# Patient Record
Sex: Female | Born: 1945 | Hispanic: No | Marital: Married | Smoking: Current every day smoker
Health system: Southern US, Community
[De-identification: ages and names within clinical notes are randomized; demographics above are authoritative.]

## PROBLEM LIST (undated history)

## (undated) DIAGNOSIS — E890 Postprocedural hypothyroidism: Secondary | ICD-10-CM

## (undated) DIAGNOSIS — I6350 Cerebral infarction due to unspecified occlusion or stenosis of unspecified cerebral artery: Secondary | ICD-10-CM

## (undated) DIAGNOSIS — E042 Nontoxic multinodular goiter: Secondary | ICD-10-CM

## (undated) HISTORY — DX: Nontoxic multinodular goiter: E04.2

## (undated) HISTORY — DX: Postprocedural hypothyroidism: E89.0

## (undated) HISTORY — DX: Cerebral infarction due to unspecified occlusion or stenosis of unspecified cerebral artery: I63.50

---

## 2008-05-31 ENCOUNTER — Encounter: Payer: Self-pay | Admitting: Endocrinology

## 2008-06-01 HISTORY — PX: OTHER SURGICAL HISTORY: SHX169

## 2008-06-15 ENCOUNTER — Encounter: Payer: Self-pay | Admitting: Endocrinology

## 2008-06-16 ENCOUNTER — Encounter: Payer: Self-pay | Admitting: Endocrinology

## 2008-06-16 HISTORY — PX: MRI: SHX5353

## 2008-07-01 ENCOUNTER — Encounter: Payer: Self-pay | Admitting: Endocrinology

## 2008-08-09 ENCOUNTER — Encounter: Payer: Self-pay | Admitting: Endocrinology

## 2008-09-27 ENCOUNTER — Encounter: Payer: Self-pay | Admitting: Endocrinology

## 2008-09-27 DIAGNOSIS — I6350 Cerebral infarction due to unspecified occlusion or stenosis of unspecified cerebral artery: Secondary | ICD-10-CM

## 2008-09-27 HISTORY — DX: Cerebral infarction due to unspecified occlusion or stenosis of unspecified cerebral artery: I63.50

## 2008-11-08 ENCOUNTER — Ambulatory Visit: Payer: Self-pay | Admitting: Endocrinology

## 2008-11-08 LAB — CONVERTED CEMR LAB: TSH: 0.03 microintl units/mL — ABNORMAL LOW (ref 0.35–5.50)

## 2008-11-25 ENCOUNTER — Encounter (INDEPENDENT_AMBULATORY_CARE_PROVIDER_SITE_OTHER): Payer: Self-pay | Admitting: *Deleted

## 2008-11-29 ENCOUNTER — Encounter: Payer: Self-pay | Admitting: Endocrinology

## 2008-12-16 ENCOUNTER — Telehealth (INDEPENDENT_AMBULATORY_CARE_PROVIDER_SITE_OTHER): Payer: Self-pay | Admitting: *Deleted

## 2008-12-27 ENCOUNTER — Ambulatory Visit: Payer: Self-pay | Admitting: Endocrinology

## 2008-12-27 DIAGNOSIS — E890 Postprocedural hypothyroidism: Secondary | ICD-10-CM

## 2008-12-27 HISTORY — DX: Postprocedural hypothyroidism: E89.0

## 2008-12-27 LAB — CONVERTED CEMR LAB: Free T4: 0.6 ng/dL (ref 0.6–1.6)

## 2009-03-02 ENCOUNTER — Ambulatory Visit: Payer: Self-pay | Admitting: Endocrinology

## 2009-03-02 LAB — CONVERTED CEMR LAB: TSH: 11.66 microintl units/mL — ABNORMAL HIGH (ref 0.35–5.50)

## 2009-07-07 ENCOUNTER — Ambulatory Visit: Payer: Self-pay | Admitting: Endocrinology

## 2009-07-07 DIAGNOSIS — E042 Nontoxic multinodular goiter: Secondary | ICD-10-CM

## 2009-07-07 HISTORY — DX: Nontoxic multinodular goiter: E04.2

## 2009-07-08 LAB — CONVERTED CEMR LAB: TSH: 3.39 microintl units/mL (ref 0.35–5.50)

## 2009-08-15 ENCOUNTER — Ambulatory Visit: Payer: Self-pay

## 2009-08-15 ENCOUNTER — Encounter (INDEPENDENT_AMBULATORY_CARE_PROVIDER_SITE_OTHER): Payer: Self-pay | Admitting: Neurology

## 2009-08-15 ENCOUNTER — Ambulatory Visit: Payer: Self-pay | Admitting: Internal Medicine

## 2009-08-15 ENCOUNTER — Ambulatory Visit (HOSPITAL_COMMUNITY): Admission: RE | Admit: 2009-08-15 | Discharge: 2009-08-15 | Payer: Self-pay | Admitting: Neurology

## 2009-08-16 ENCOUNTER — Encounter (INDEPENDENT_AMBULATORY_CARE_PROVIDER_SITE_OTHER): Payer: Self-pay | Admitting: Radiology

## 2010-05-02 ENCOUNTER — Ambulatory Visit: Payer: Self-pay | Admitting: Endocrinology

## 2010-11-14 NOTE — Assessment & Plan Note (Signed)
Summary: f/u appt/#/cd   Vital Signs:  Patient profile:   65 year old female Height:      66 inches (167.64 cm) Weight:      191.25 pounds (86.93 kg) BMI:     30.98 O2 Sat:      98 % on Room air Temp:     97.3 degrees F (36.28 degrees C) oral Pulse rate:   77 / minute BP sitting:   142 / 86  (left arm) Cuff size:   regular  Vitals Entered By: Brenton Grills MA (May 02, 2010 2:23 PM)  O2 Flow:  Room air CC: F/U appt/pt needs refill on Levothroxine/aj Comments Pt is no longer taking Diovan, Naproxen, Plavix   Referring Provider:  Georgana Curio, NP ([facility]) Primary Provider:  DR Jerrye Bushy [facility] [phone])  CC:  F/U appt/pt needs refill on Levothroxine/aj.  History of Present Illness: pt had i-131 rx for hyperthyroidism in 2009, for multinodular goiter.  she does not notice the goiter. she takes synthroid 50/d as rx'ed.     Current Medications (verified): 1)  Diovan Hct 160-12.5 Mg Tabs (Valsartan-Hydrochlorothiazide) .... Take 1 By Mouth Qd 2)  Naproxen 500 Mg Tabs (Naproxen) .... Take 1 Two Times A Day 3)  Plavix 75 Mg Tabs (Clopidogrel Bisulfate) .Marland Kitchen.. 1 Daily 4)  Levothyroxine Sodium 50 Mcg Tabs (Levothyroxine Sodium) .Marland Kitchen.. 1 Qd 5)  Benicar Hct 40-25 Mg Tabs (Olmesartan Medoxomil-Hctz) .Marland Kitchen.. 1 Tablet By Mouth Once Daily  Allergies (verified): 1)  ! Penicillin  Past History:  Past Medical History: Last updated: 11/08/2008 HYPERTHYROIDISM (ICD-242.90) DIABETES MELLITUS, FAMILY HX (ICD-V18.0) CEREBROVASCULAR ACCIDENT (ICD-434.91)  Review of Systems       The patient complains of weight gain.    Physical Exam  General:  normal appearance.   Neck:  thyroid is minimally enlarged bilat, with an irregular surface, but no palpable nodule. Additional Exam:  FastTSH                   5.39 uIU/mL     Impression & Recommendations:  Problem # 1:  GOITER, MULTINODULAR (ICD-241.1) Assessment Unchanged  Problem # 2:  HYPOTHYROIDISM, POST-RADIATION  (ICD-244.1) well-controlled  Medications Added to Medication List This Visit: 1)  Benicar Hct 40-25 Mg Tabs (Olmesartan medoxomil-hctz) .Marland Kitchen.. 1 tablet by mouth once daily  Other Orders: TLB-TSH (Thyroid Stimulating Hormone) (84443-TSH) Est. Patient Level III (29562)  Patient Instructions: 1)  blood tests are being ordered for you today.  please call [phone] to hear your test results. 2)  pending the test results, please continue the same levothyroxine (50 micrograms/day) 3)  return 6 months 4)  because you require less than a full amount of thyroid hormone, you are at risk for recurrence of the overactive thyroid. 5)  (update: i left message on phone-tree:  rx as we discussed) Prescriptions: LEVOTHYROXINE SODIUM 50 MCG TABS (LEVOTHYROXINE SODIUM) 1 qd  #30 Tablet x 6   Entered and Authorized by:   Minus Breeding MD   Signed by:   Minus Breeding MD on 05/02/2010   Method used:   Electronically to        [facility] [address]       [address] [place]  [address]       Ph: [phone]       Fax: [phone]   RxID:   [id number]

## 2010-11-21 ENCOUNTER — Other Ambulatory Visit: Payer: MEDICARE

## 2010-11-21 ENCOUNTER — Encounter: Payer: Self-pay | Admitting: Endocrinology

## 2010-11-21 ENCOUNTER — Other Ambulatory Visit: Payer: Self-pay | Admitting: Endocrinology

## 2010-11-21 ENCOUNTER — Ambulatory Visit (INDEPENDENT_AMBULATORY_CARE_PROVIDER_SITE_OTHER): Payer: MEDICARE | Admitting: Endocrinology

## 2010-11-21 ENCOUNTER — Encounter (INDEPENDENT_AMBULATORY_CARE_PROVIDER_SITE_OTHER): Payer: Self-pay | Admitting: *Deleted

## 2010-11-21 DIAGNOSIS — E042 Nontoxic multinodular goiter: Secondary | ICD-10-CM

## 2010-11-21 DIAGNOSIS — R062 Wheezing: Secondary | ICD-10-CM

## 2010-11-21 DIAGNOSIS — E890 Postprocedural hypothyroidism: Secondary | ICD-10-CM

## 2010-11-30 NOTE — Assessment & Plan Note (Signed)
Summary: f/u app/#   Vital Signs:  Patient profile:   65 year old female Menstrual status:  postmenopausal Height:      66 inches (167.64 cm) Weight:      208.75 pounds (94.89 kg) BMI:     33.81 O2 Sat:      97 % on Room air Temp:     98.2 degrees F (36.78 degrees C) oral Pulse rate:   77 / minute Pulse rhythm:   regular BP sitting:   142 / 88  (left arm) Cuff size:   large  Vitals Entered By: Brenton Grills CMA [facility] Dull) (November 21, 2010 1:34 PM)  O2 Flow:  Room air CC: Follow up on thyroid/refill on Levothyroxine/pt declined flu shot/aj Is Patient Diabetic? No Comments Pt is due for mammogram and may due for pap and has never had a colonoscopy or pneumovax     Menstrual Status postmenopausal   Referring Provider:  Georgana Curio, NP [facility]) Primary Provider:  DR Jerrye Bushy [facility] [phone])  CC:  Follow up on thyroid/refill on Levothyroxine/pt declined flu shot/aj.  History of Present Illness: pt had i-131 rx for hyperthyroidism in 2009, for multinodular goiter.  she does not notice the goiter. she takes synthroid 50/d as rx'ed.   pt states few days of of moderate congestion in the nose, but no assoc sore throat.  she has wheezing.   Current Medications (verified): 1)  Levothyroxine Sodium 50 Mcg Tabs (Levothyroxine Sodium) .Marland Kitchen.. 1 Qd 2)  Benicar Hct 40-25 Mg Tabs (Olmesartan Medoxomil-Hctz) .Marland Kitchen.. 1 Tablet By Mouth Once Daily  Allergies (verified): 1)  ! Penicillin  Past History:  Past Medical History: Last updated: 11/08/2008 HYPERTHYROIDISM (ICD-242.90) DIABETES MELLITUS, FAMILY HX (ICD-V18.0) CEREBROVASCULAR ACCIDENT (ICD-434.91)  Review of Systems       The patient complains of weight gain.  The patient denies fever and prolonged cough.         denies earache  Physical Exam  General:  normal appearance.   Neck:  thyroid is minimally enlarged bilat, with an irregular surface, but no palpable nodule. Additional Exam:  FastTSH               [H]  7.84 uIU/mL      Impression & Recommendations:  Problem # 1:  HYPOTHYROIDISM, POST-RADIATION (ICD-244.1) needs increased rx  Problem # 2:  uri new  Problem # 3:  GOITER, MULTINODULAR (ICD-241.1) Assessment: Improved  Medications Added to Medication List This Visit: 1)  Azithromycin 500 Mg Tabs (Azithromycin) .Marland Kitchen.. 1 tab once daily 2)  Levothyroxine Sodium 75 Mcg Tabs (Levothyroxine sodium) .Marland Kitchen.. 1 tab once daily  Other Orders: Spirometry w/Graph (94010) TLB-TSH (Thyroid Stimulating Hormone) (84443-TSH) Est. Patient Level IV (08657)  Patient Instructions: 1)  blood tests are being ordered for you today.  please call [phone] to hear your test results. 2)  here is a sample of "advair-100."  take 1 puff two times a day.  rinse mouth after using.  3)  take azithromycin 500 mg once daily 4)  take loratadine-d (non-prescription) as needed for congestion. 5)  Please schedule a follow-up appointment in 6 months. 6)  (update: i left message on phone-tree:  increase synthroid to 75 micrograms/d) Prescriptions: LEVOTHYROXINE SODIUM 75 MCG TABS (LEVOTHYROXINE SODIUM) 1 tab once daily  #90 x 2   Entered and Authorized by:   Minus Breeding MD   Signed by:   Minus Breeding MD on 11/21/2010   Method used:   [facility] [address]       [address], [place]  [address]       Ph: [phone]       Fax: [phone]   RxID:   [id number] AZITHROMYCIN 500 MG TABS (AZITHROMYCIN) 1 tab once daily  #6 x 0   Entered and Authorized by:   Minus Breeding MD   Signed by:   Minus Breeding MD on 11/21/2010   Method used:   [facility]  [address]       [address], [place]  [address]       Ph: [phone]       Fax: [phone]   RxID:   [id number]    Orders Added: 1)  Spirometry w/Graph [94010] 2)  TLB-TSH (Thyroid Stimulating Hormone) [84443-TSH] 3)  Est. Patient Level IV [12458]

## 2011-08-30 ENCOUNTER — Other Ambulatory Visit: Payer: Self-pay | Admitting: *Deleted

## 2011-08-30 MED ORDER — LEVOTHYROXINE SODIUM 75 MCG PO TABS: 75.0000 ug | ORAL_TABLET | Freq: Every day | ORAL | Status: DC

## 2011-08-30 NOTE — Telephone Encounter (Signed)
Left msg on vm mother is needing refill on thyroid med....08/30/11@12 :16pm/LMB

## 2011-08-30 NOTE — Telephone Encounter (Signed)
Rx sent to pharmacy, pt informed via VM and to callback office with any questions/concerns.

## 2012-02-27 ENCOUNTER — Other Ambulatory Visit: Payer: Self-pay | Admitting: Endocrinology

## 2012-03-27 ENCOUNTER — Encounter: Payer: Self-pay | Admitting: Endocrinology

## 2012-03-27 ENCOUNTER — Other Ambulatory Visit (INDEPENDENT_AMBULATORY_CARE_PROVIDER_SITE_OTHER): Payer: Medicare Other

## 2012-03-27 ENCOUNTER — Ambulatory Visit (INDEPENDENT_AMBULATORY_CARE_PROVIDER_SITE_OTHER): Payer: Medicare Other | Admitting: Endocrinology

## 2012-03-27 VITALS — BP 140/84 | HR 84 | Temp 98.3°F | Ht 65.0 in | Wt 194.0 lb

## 2012-03-27 DIAGNOSIS — E890 Postprocedural hypothyroidism: Secondary | ICD-10-CM

## 2012-03-27 LAB — TSH: TSH: 3.89 u[IU]/mL (ref 0.35–5.50)

## 2012-03-27 NOTE — Progress Notes (Signed)
  Subjective:    Patient ID: [name], female    DOB: [birth year], 66 y.o.   MRN: [id number]  HPI In 2009, pt had i-131 rx for hyperthyroidism, due to a multinodular goiter.  she does not notice the goiter. she takes synthroid 75/d as rx'ed.  pt states she feels well in general. Past Medical History  Diagnosis Date  . CEREBROVASCULAR ACCIDENT 09/27/2008    Qualifier: Diagnosis of  By: Charlsie Quest RMA, Lucy    . GOITER, MULTINODULAR 07/07/2009    Qualifier: Diagnosis of  By: Everardo All MD, Cleophas Dunker   . HYPOTHYROIDISM, POST-RADIATION 12/27/2008    Qualifier: Diagnosis of  By: Everardo All MD, Cleophas Dunker     Past Surgical History  Procedure Date  . Mri 06/16/2008    cervical spine  . Thyroid uptake 06/01/2008    History   Social History  . Marital Status: Married    Spouse Name: N/A    Number of Children: N/A  . Years of Education: N/A   Occupational History  . Retired    Social History Main Topics  . Smoking status: Current Everyday Smoker  . Smokeless tobacco: Not on file  . Alcohol Use: Not on file  . Drug Use: Not on file  . Sexually Active: Not on file   Other Topics Concern  . Not on file   Social History Narrative   Here with son [name])    Current Outpatient Prescriptions on File Prior to Visit  Medication Sig Dispense Refill  . olmesartan-hydrochlorothiazide (BENICAR HCT) 40-25 MG per tablet Take 1 tablet by mouth daily.      Marland Kitchen levothyroxine (SYNTHROID, LEVOTHROID) 75 MCG tablet TAKE 1 TABLET BY MOUTH EVERY DAY  30 tablet  11    Allergies  Allergen Reactions  . Penicillins     Family History  Problem Relation Age of Onset  . Thyroid disease Neg Hx     No thyroid disease in immediate family    BP 140/84  Pulse 84  Temp 98.3 F (36.8 C) (Oral)  Ht 5\' 5"  (1.651 m)  Wt 194 lb (87.998 kg)  BMI 32.28 kg/m2  SpO2 97%  Review of Systems Denies weight change    Objective:   Physical Exam VITAL SIGNS:  See vs page GENERAL: no distress.  In  wheelchair NECK: There is no palpable thyroid enlargement.  No thyroid nodule is palpable.  No palpable lymphadenopathy at the anterior neck.  Lab Results  Component Value Date   TSH 3.89 03/27/2012      Assessment & Plan:  Hypothyroidism.  well-replaced

## 2012-03-27 NOTE — Patient Instructions (Addendum)
blood tests are being requested for you today.  You will receive a letter with results. Please return in 1 year. most of the time, a "lumpy thyroid" will eventually become overactive again.  this is usually a slow process, happening over the span of many years.  If this happens, the first thing we would notice is that you would not need as much levothyroxine.

## 2012-03-28 ENCOUNTER — Telehealth: Payer: Self-pay | Admitting: *Deleted

## 2012-03-28 MED ORDER — LEVOTHYROXINE SODIUM 75 MCG PO TABS: ORAL_TABLET | ORAL | Status: AC

## 2012-03-28 NOTE — Telephone Encounter (Signed)
Called pt to inform of lab results, pt informed via VM and to callback office with any questions/concerns (letter also mailed to pt) also sent in rx for Levothyroxine for 1 year.

## 2012-12-13 ENCOUNTER — Ambulatory Visit: Payer: Self-pay | Admitting: Internal Medicine

## 2013-01-04 ENCOUNTER — Inpatient Hospital Stay: Payer: Self-pay | Admitting: Internal Medicine

## 2013-01-04 LAB — COMPREHENSIVE METABOLIC PANEL
Albumin: 2.8 g/dL — ABNORMAL LOW (ref 3.4–5.0)
Alkaline Phosphatase: 157 U/L — ABNORMAL HIGH (ref 50–136)
Anion Gap: 5 — ABNORMAL LOW (ref 7–16)
BUN: 64 mg/dL — ABNORMAL HIGH (ref 7–18)
Calcium, Total: 8.3 mg/dL — ABNORMAL LOW (ref 8.5–10.1)
EGFR (African American): 34 — ABNORMAL LOW
Potassium: 4.5 mmol/L (ref 3.5–5.1)
SGOT(AST): 42 U/L — ABNORMAL HIGH (ref 15–37)

## 2013-01-04 LAB — CBC
MCH: 32.2 pg (ref 26.0–34.0)
MCV: 96 fL (ref 80–100)
Platelet: 336 10*3/uL (ref 150–440)
RBC: 3.27 10*6/uL — ABNORMAL LOW (ref 3.80–5.20)
RDW: 16.3 % — ABNORMAL HIGH (ref 11.5–14.5)
WBC: 17.8 10*3/uL — ABNORMAL HIGH (ref 3.6–11.0)

## 2013-01-04 LAB — CK TOTAL AND CKMB (NOT AT ARMC): CK, Total: 129 U/L (ref 21–215)

## 2013-01-05 LAB — COMPREHENSIVE METABOLIC PANEL
BUN: 55 mg/dL — ABNORMAL HIGH (ref 7–18)
Bilirubin,Total: 0.4 mg/dL (ref 0.2–1.0)
Calcium, Total: 7.7 mg/dL — ABNORMAL LOW (ref 8.5–10.1)
Co2: 33 mmol/L — ABNORMAL HIGH (ref 21–32)
EGFR (African American): 36 — ABNORMAL LOW
EGFR (Non-African Amer.): 31 — ABNORMAL LOW
Osmolality: 290 (ref 275–301)
Sodium: 136 mmol/L (ref 136–145)
Total Protein: 7.3 g/dL (ref 6.4–8.2)

## 2013-01-05 LAB — CBC WITH DIFFERENTIAL/PLATELET
Basophil %: 0.4 %
Eosinophil #: 0 10*3/uL (ref 0.0–0.7)
HCT: 24.6 % — ABNORMAL LOW (ref 35.0–47.0)
HGB: 8.3 g/dL — ABNORMAL LOW (ref 12.0–16.0)
Lymphocyte #: 1.6 10*3/uL (ref 1.0–3.6)
Lymphocyte %: 10.1 %
MCH: 31.8 pg (ref 26.0–34.0)
MCHC: 33.9 g/dL (ref 32.0–36.0)
MCV: 94 fL (ref 80–100)
Monocyte #: 1 x10 3/mm — ABNORMAL HIGH (ref 0.2–0.9)
Monocyte %: 6.2 %
Neutrophil #: 12.8 10*3/uL — ABNORMAL HIGH (ref 1.4–6.5)
Neutrophil %: 83.2 %
RDW: 16.6 % — ABNORMAL HIGH (ref 11.5–14.5)
WBC: 15.5 10*3/uL — ABNORMAL HIGH (ref 3.6–11.0)

## 2013-01-05 LAB — CK TOTAL AND CKMB (NOT AT ARMC)
CK, Total: 128 U/L (ref 21–215)
CK-MB: <0.5 ng/mL — ABNORMAL LOW (ref 0.5–3.6)

## 2013-01-05 LAB — PHOSPHORUS: Phosphorus: 2.1 mg/dL — ABNORMAL LOW (ref 2.5–4.9)

## 2013-01-05 LAB — MAGNESIUM: Magnesium: 2.3 mg/dL

## 2013-01-05 LAB — TROPONIN I
Troponin-I: 0.19 ng/mL — ABNORMAL HIGH
Troponin-I: 0.15 ng/mL — ABNORMAL HIGH

## 2013-01-06 LAB — BASIC METABOLIC PANEL
BUN: 33 mg/dL — ABNORMAL HIGH (ref 7–18)
Chloride: 98 mmol/L (ref 98–107)
Co2: 33 mmol/L — ABNORMAL HIGH (ref 21–32)
Creatinine: 1.35 mg/dL — ABNORMAL HIGH (ref 0.60–1.30)
EGFR (African American): 47 — ABNORMAL LOW
EGFR (Non-African Amer.): 41 — ABNORMAL LOW
Glucose: 149 mg/dL — ABNORMAL HIGH (ref 65–99)
Osmolality: 280 (ref 275–301)
Sodium: 135 mmol/L — ABNORMAL LOW (ref 136–145)

## 2013-01-06 LAB — CBC WITH DIFFERENTIAL/PLATELET
Basophil #: 0.1 10*3/uL (ref 0.0–0.1)
Eosinophil %: 3.2 %
HCT: 21.9 % — ABNORMAL LOW (ref 35.0–47.0)
HGB: 7.8 g/dL — ABNORMAL LOW (ref 12.0–16.0)
Lymphocyte #: 1.3 10*3/uL (ref 1.0–3.6)
Lymphocyte %: 11.6 %
MCHC: 35.5 g/dL (ref 32.0–36.0)
Monocyte #: 1.1 x10 3/mm — ABNORMAL HIGH (ref 0.2–0.9)
Monocyte %: 9.5 %
Neutrophil %: 74.4 %
RBC: 2.39 10*6/uL — ABNORMAL LOW (ref 3.80–5.20)
RDW: 16.7 % — ABNORMAL HIGH (ref 11.5–14.5)

## 2013-01-06 LAB — PHOSPHORUS: Phosphorus: 2.8 mg/dL (ref 2.5–4.9)

## 2013-01-06 LAB — MAGNESIUM: Magnesium: 1.8 mg/dL

## 2013-01-07 LAB — BASIC METABOLIC PANEL
BUN: 25 mg/dL — ABNORMAL HIGH (ref 7–18)
Co2: 29 mmol/L (ref 21–32)
EGFR (African American): 57 — ABNORMAL LOW
EGFR (Non-African Amer.): 49 — ABNORMAL LOW
Osmolality: 281 (ref 275–301)
Potassium: 3.9 mmol/L (ref 3.5–5.1)
Sodium: 137 mmol/L (ref 136–145)

## 2013-01-07 LAB — CBC WITH DIFFERENTIAL/PLATELET
Basophil #: 0 10*3/uL (ref 0.0–0.1)
Basophil %: 0.1 %
Eosinophil #: 0 10*3/uL (ref 0.0–0.7)
Eosinophil %: 0 %
HGB: 7.3 g/dL — ABNORMAL LOW (ref 12.0–16.0)
Lymphocyte %: 8.4 %
MCHC: 34.3 g/dL (ref 32.0–36.0)
MCV: 94 fL (ref 80–100)
Monocyte %: 6 %
Neutrophil #: 6.7 10*3/uL — ABNORMAL HIGH (ref 1.4–6.5)
Platelet: 255 10*3/uL (ref 150–440)
RBC: 2.26 10*6/uL — ABNORMAL LOW (ref 3.80–5.20)
RDW: 16.4 % — ABNORMAL HIGH (ref 11.5–14.5)

## 2013-01-08 LAB — BASIC METABOLIC PANEL
Anion Gap: 4 — ABNORMAL LOW (ref 7–16)
BUN: 33 mg/dL — ABNORMAL HIGH (ref 7–18)
Creatinine: 1.09 mg/dL (ref 0.60–1.30)
EGFR (African American): >60
EGFR (Non-African Amer.): 52 — ABNORMAL LOW
Glucose: 142 mg/dL — ABNORMAL HIGH (ref 65–99)
Osmolality: 287 (ref 275–301)
Potassium: 3.5 mmol/L (ref 3.5–5.1)

## 2013-01-08 LAB — OCCULT BLOOD X 1 CARD TO LAB, STOOL: Occult Blood, Feces: POSITIVE

## 2013-01-08 LAB — CBC WITH DIFFERENTIAL/PLATELET
HGB: 7 g/dL — ABNORMAL LOW (ref 12.0–16.0)
Lymphocyte %: 7.6 %
MCV: 92 fL (ref 80–100)
Monocyte #: 0.4 x10 3/mm (ref 0.2–0.9)
Monocyte %: 4.2 %
Neutrophil %: 87.6 %
RDW: 16.5 % — ABNORMAL HIGH (ref 11.5–14.5)

## 2013-01-08 LAB — MAGNESIUM: Magnesium: 2.1 mg/dL

## 2013-01-09 LAB — CBC WITH DIFFERENTIAL/PLATELET
Basophil #: 0 10*3/uL (ref 0.0–0.1)
Basophil %: 0.1 %
Eosinophil #: 0 10*3/uL (ref 0.0–0.7)
Eosinophil %: 0 %
HCT: 26.9 % — ABNORMAL LOW (ref 35.0–47.0)
Lymphocyte #: 0.8 10*3/uL — ABNORMAL LOW (ref 1.0–3.6)
MCH: 31.9 pg (ref 26.0–34.0)
MCV: 92 fL (ref 80–100)
Monocyte #: 0.5 x10 3/mm (ref 0.2–0.9)
Monocyte %: 5.1 %
Neutrophil #: 8.7 10*3/uL — ABNORMAL HIGH (ref 1.4–6.5)
Neutrophil %: 87.3 %
Platelet: 282 10*3/uL (ref 150–440)
WBC: 10 10*3/uL (ref 3.6–11.0)

## 2013-01-09 LAB — BASIC METABOLIC PANEL
Anion Gap: 4 — ABNORMAL LOW (ref 7–16)
Chloride: 106 mmol/L (ref 98–107)
Co2: 31 mmol/L (ref 21–32)
Creatinine: 1.06 mg/dL (ref 0.60–1.30)
EGFR (African American): >60
EGFR (Non-African Amer.): 54 — ABNORMAL LOW
Osmolality: 294 (ref 275–301)
Potassium: 3 mmol/L — ABNORMAL LOW (ref 3.5–5.1)
Sodium: 141 mmol/L (ref 136–145)

## 2013-01-09 LAB — POTASSIUM: Potassium: 3.2 mmol/L — ABNORMAL LOW (ref 3.5–5.1)

## 2013-01-10 LAB — CBC WITH DIFFERENTIAL/PLATELET
Basophil #: 0 10*3/uL (ref 0.0–0.1)
Eosinophil #: 0 10*3/uL (ref 0.0–0.7)
HCT: 27.9 % — ABNORMAL LOW (ref 35.0–47.0)
Lymphocyte #: 0.8 10*3/uL — ABNORMAL LOW (ref 1.0–3.6)
Lymphocyte %: 8.3 %
MCHC: 33.2 g/dL (ref 32.0–36.0)
MCV: 90 fL (ref 80–100)
Monocyte #: 0.6 x10 3/mm (ref 0.2–0.9)
Monocyte %: 5.6 %
Neutrophil #: 8.5 10*3/uL — ABNORMAL HIGH (ref 1.4–6.5)
Neutrophil %: 85.9 %
RBC: 3.1 10*6/uL — ABNORMAL LOW (ref 3.80–5.20)
WBC: 9.9 10*3/uL (ref 3.6–11.0)

## 2013-01-10 LAB — BASIC METABOLIC PANEL
Anion Gap: 6 — ABNORMAL LOW (ref 7–16)
BUN: 37 mg/dL — ABNORMAL HIGH (ref 7–18)
Calcium, Total: 7.8 mg/dL — ABNORMAL LOW (ref 8.5–10.1)
Chloride: 108 mmol/L — ABNORMAL HIGH (ref 98–107)
EGFR (African American): >60
Glucose: 137 mg/dL — ABNORMAL HIGH (ref 65–99)
Potassium: 3.4 mmol/L — ABNORMAL LOW (ref 3.5–5.1)
Sodium: 143 mmol/L (ref 136–145)

## 2013-01-10 LAB — MAGNESIUM: Magnesium: 2.3 mg/dL

## 2013-01-11 LAB — MAGNESIUM: Magnesium: 2.2 mg/dL

## 2013-01-11 LAB — POTASSIUM: Potassium: 3.6 mmol/L (ref 3.5–5.1)

## 2013-01-12 LAB — BASIC METABOLIC PANEL
Anion Gap: 5 — ABNORMAL LOW (ref 7–16)
Calcium, Total: 7.8 mg/dL — ABNORMAL LOW (ref 8.5–10.1)
Co2: 28 mmol/L (ref 21–32)
Creatinine: 0.91 mg/dL (ref 0.60–1.30)
Glucose: 92 mg/dL (ref 65–99)
Potassium: 3.6 mmol/L (ref 3.5–5.1)
Sodium: 142 mmol/L (ref 136–145)

## 2013-01-12 LAB — CBC WITH DIFFERENTIAL/PLATELET
Basophil #: 0.1 10*3/uL (ref 0.0–0.1)
Basophil %: 0.7 %
HCT: 29.1 % — ABNORMAL LOW (ref 35.0–47.0)
HGB: 10.1 g/dL — ABNORMAL LOW (ref 12.0–16.0)
Lymphocyte #: 1.3 10*3/uL (ref 1.0–3.6)
MCH: 31.1 pg (ref 26.0–34.0)
MCHC: 34.7 g/dL (ref 32.0–36.0)
Monocyte #: 0.9 x10 3/mm (ref 0.2–0.9)
Neutrophil #: 6.7 10*3/uL — ABNORMAL HIGH (ref 1.4–6.5)
Neutrophil %: 70 %
RBC: 3.25 10*6/uL — ABNORMAL LOW (ref 3.80–5.20)
RDW: 17.7 % — ABNORMAL HIGH (ref 11.5–14.5)
WBC: 9.6 10*3/uL (ref 3.6–11.0)

## 2013-01-12 LAB — MAGNESIUM: Magnesium: 1.9 mg/dL

## 2013-01-13 ENCOUNTER — Ambulatory Visit: Payer: Self-pay | Admitting: Internal Medicine

## 2013-01-13 LAB — MAGNESIUM: Magnesium: 2 mg/dL

## 2013-01-13 LAB — BASIC METABOLIC PANEL
Co2: 28 mmol/L (ref 21–32)
EGFR (African American): >60
Sodium: 142 mmol/L (ref 136–145)

## 2013-01-13 LAB — PHOSPHORUS: Phosphorus: 2.9 mg/dL (ref 2.5–4.9)

## 2013-01-14 LAB — BASIC METABOLIC PANEL
Anion Gap: 6 — ABNORMAL LOW (ref 7–16)
Calcium, Total: 7.8 mg/dL — ABNORMAL LOW (ref 8.5–10.1)
Chloride: 104 mmol/L (ref 98–107)
EGFR (African American): >60

## 2013-01-14 LAB — CBC WITH DIFFERENTIAL/PLATELET
Eosinophil %: 5.2 %
HGB: 10.1 g/dL — ABNORMAL LOW (ref 12.0–16.0)
Lymphocyte #: 1.2 10*3/uL (ref 1.0–3.6)
Lymphocyte %: 16.8 %
MCH: 30.1 pg (ref 26.0–34.0)
MCV: 90 fL (ref 80–100)
Monocyte #: 0.7 x10 3/mm (ref 0.2–0.9)
Monocyte %: 9.7 %
Neutrophil #: 4.9 10*3/uL (ref 1.4–6.5)
Neutrophil %: 67.9 %
Platelet: 281 10*3/uL (ref 150–440)
RDW: 17.8 % — ABNORMAL HIGH (ref 11.5–14.5)
WBC: 7.2 10*3/uL (ref 3.6–11.0)

## 2013-01-16 LAB — POTASSIUM: Potassium: 3.8 mmol/L (ref 3.5–5.1)

## 2013-01-16 LAB — MAGNESIUM: Magnesium: 1.8 mg/dL

## 2013-01-19 LAB — CBC WITH DIFFERENTIAL/PLATELET
Basophil #: 0.1 10*3/uL (ref 0.0–0.1)
Basophil %: 1.2 %
Eosinophil #: 0.3 10*3/uL (ref 0.0–0.7)
Eosinophil %: 3.7 %
HGB: 10.8 g/dL — ABNORMAL LOW (ref 12.0–16.0)
MCH: 30.3 pg (ref 26.0–34.0)
MCHC: 33.7 g/dL (ref 32.0–36.0)
MCV: 90 fL (ref 80–100)
Monocyte #: 0.7 x10 3/mm (ref 0.2–0.9)
Neutrophil %: 69 %
RDW: 17.2 % — ABNORMAL HIGH (ref 11.5–14.5)
WBC: 7.9 10*3/uL (ref 3.6–11.0)

## 2013-01-19 LAB — BASIC METABOLIC PANEL
BUN: 26 mg/dL — ABNORMAL HIGH (ref 7–18)
Chloride: 98 mmol/L (ref 98–107)
EGFR (Non-African Amer.): 54 — ABNORMAL LOW
Glucose: 99 mg/dL (ref 65–99)
Osmolality: 277 (ref 275–301)
Sodium: 136 mmol/L (ref 136–145)

## 2013-01-24 ENCOUNTER — Emergency Department: Payer: Self-pay | Admitting: Emergency Medicine

## 2013-02-04 ENCOUNTER — Emergency Department: Payer: Self-pay | Admitting: Emergency Medicine

## 2013-07-15 DEATH — deceased

## 2015-02-04 NOTE — Consult Note (Signed)
Chief Complaint:  Subjective/Chief Complaint Patient seen for anemia/heme positive stool.  no change.  Nursing using the G tube portion for uses as J tube is clogged.  I have ordered a replacement J tube portion  of the feeding tube which can be changed under fluoro over a wire.  Will likely be able to do on thursday.   VITAL SIGNS/ANCILLARY NOTES: **Vital Signs.:   01-Apr-14 19:41  Temperature Temperature (F) 98.5  Celsius 36.9  Pulse Pulse 82  Respirations Respirations 21  Systolic BP Systolic BP 152  Diastolic BP (mmHg) Diastolic BP (mmHg) 89  Mean BP 110  Pulse Ox % Pulse Ox % 99  Pulse Ox Activity Level  At rest  Oxygen Delivery Trach Aerosol Mask  Pulse Ox Heart Rate 84   Electronic Signatures: Barnetta ChapelSkulskie, Avyanna Spada (MD)  (Signed 01-Apr-14 20:24)  Authored: Chief Complaint, VITAL SIGNS/ANCILLARY NOTES   Last Updated: 01-Apr-14 20:24 by Barnetta ChapelSkulskie, Kristian Mogg (MD)

## 2015-02-04 NOTE — Consult Note (Signed)
Chief Complaint:  Subjective/Chief Complaint seen for anemia, heme positive stool.  no change, minimal interaction from patietn to examiner.   VITAL SIGNS/ANCILLARY NOTES: **Vital Signs.:   31-Mar-14 12:00  Vital Signs Type Routine  Temperature Temperature (F) 97.6  Celsius 36.4  Temperature Source axillary  Pulse Pulse 70  Respirations Respirations 15  Pulse Ox % Pulse Ox % 96  Oxygen Delivery Trach Aerosol Mask  Pulse Ox Heart Rate 72    16:00  Vital Signs Type Routine  Pulse Pulse 82  Respirations Respirations 26  Systolic BP Systolic BP 244  Diastolic BP (mmHg) Diastolic BP (mmHg) 96  Mean BP 116  Pulse Ox % Pulse Ox % 97  Oxygen Delivery Trach Aerosol Mask  Pulse Ox Heart Rate 82   Brief Assessment:  Cardiac Regular   Respiratory clear BS   Gastrointestinal details normal Soft  Nontender  Nondistended  No masses palpable  Bowel sounds normal   Lab Results: Routine Chem:  31-Mar-14 04:31   B-Type Natriuretic Peptide ([facility])  1325 (Result(s) reported on 12 Jan 2013 at 08:57AM.)  Magnesium, Serum 1.9 (1.8-2.4 THERAPEUTIC RANGE: 4-7 mg/dL TOXIC: > 10 mg/dL  -----------------------)  Phosphorus, Serum 3.0 (Result(s) reported on 12 Jan 2013 at 04:59AM.)  Result Comment LABS - This specimen was collected through an   - indwelling catheter or arterial line.  - A minimum of 45ms of blood was wasted prior    - to collecting the sample.  Interpret  - results with caution.  Result(s) reported on 12 Jan 2013 at 04:50AM.  Glucose, Serum 92  BUN  23  Creatinine (comp) 0.91  Sodium, Serum 142  Potassium, Serum 3.6  Chloride, Serum  109  CO2, Serum 28  Calcium (Total), Serum  7.8  Anion Gap  5  Osmolality (calc) 286  eGFR (African American) >60  eGFR (Non-African American) >60 (eGFR values <666mmin/1.73 m2 may be an indication of chronic kidney disease (CKD). Calculated eGFR is useful in patients with stable renal function. The eGFR calculation will not be reliable  in acutely ill patients when serum creatinine is changing rapidly. It is not useful in  patients on dialysis. The eGFR calculation may not be applicable to patients at the low and high extremes of body sizes, pregnant women, and vegetarians.)  Routine Hem:  23-Mar-14 17:56   Hemoglobin (CBC)  10.5  24-Mar-14 04:05   Hemoglobin (CBC)  8.3  25-Mar-14 03:48   Hemoglobin (CBC)  7.8  26-Mar-14 04:08   Hemoglobin (CBC)  7.3  27-Mar-14 03:32   Hemoglobin (CBC)  7.0  28-Mar-14 03:54   Hemoglobin (CBC)  9.4  29-Mar-14 04:23   Hemoglobin (CBC)  9.3  31-Mar-14 04:31   WBC (CBC) 9.6  RBC (CBC)  3.25  Hemoglobin (CBC)  10.1  Hematocrit (CBC)  29.1  Platelet Count (CBC) 292  MCV 90  MCH 31.1  MCHC 34.7  RDW  17.7  Neutrophil % 70.0  Lymphocyte % 13.8  Monocyte % 8.9  Eosinophil % 6.6  Basophil % 0.7  Neutrophil #  6.7  Lymphocyte # 1.3  Monocyte # 0.9  Eosinophil # 0.6  Basophil # 0.1   Assessment/Plan:  Assessment/Plan:  Assessment 1) heme positive stool, no overt bleeding.  hgb stable.  2) PEG malfunction due to clogged J-port.  G-port is good and we will allow feeds through that port.  The J-tube portion can be changed over a wire under fluoro, and I will order the necessary part.  To change  the entire tube would require sedation and patient is high risk for this due to comorbidities.   Plan 1) as above. use of G-tube port discussed with nursing, flushes, etc.   Electronic Signatures: Loistine Simas (MD)  (Signed 31-Mar-14 17:33)  Authored: Chief Complaint, VITAL SIGNS/ANCILLARY NOTES, Brief Assessment, Lab Results, Assessment/Plan   Last Updated: 31-Mar-14 17:33 by Loistine Simas (MD)

## 2015-02-04 NOTE — Consult Note (Signed)
Chief Complaint:  Subjective/Chief Complaint seen for heme positive stool and j-tube problems.  no change, minimal interaction with examined.  no response to questions.   VITAL SIGNS/ANCILLARY NOTES: **Vital Signs.:   04-Apr-14 05:00  Vital Signs Type Routine  Temperature Temperature (F) 97.8  Celsius 36.5  Temperature Source axillary  Pulse Pulse 85  Respirations Respirations 20  Systolic BP Systolic BP 129  Diastolic BP (mmHg) Diastolic BP (mmHg) 80  Mean BP 96  Pulse Ox % Pulse Ox % 95  Pulse Ox Activity Level  At rest  Oxygen Delivery Trach Aerosol Mask    06:03  Vital Signs Type POCT  Nurse Fingerstick (mg/dL) FSBS (fasting range 16-1065-99 mg/dL) 960113  Comments/Interventions  Nurse Notified   Brief Assessment:  Cardiac Regular   Respiratory clear BS   Gastrointestinal details normal Soft  Nontender  Nondistended  No masses palpable  Bowel sounds normal   Lab Results: Routine Chem:  04-Apr-14 11:54   Magnesium, Serum 1.8 (1.8-2.4 THERAPEUTIC RANGE: 4-7 mg/dL TOXIC: > 10 mg/dL  -----------------------)  Potassium, Serum 3.8 (Result(s) reported on 16 Jan 2013 at 12:34PM.)  Phosphorus, Serum 2.8 (Result(s) reported on 16 Jan 2013 at 12:37PM.)  Routine Hem:  02-Apr-14 05:23   Platelet Count (CBC) 281   Assessment/Plan:  Assessment/Plan:  Assessment 1) heme positive stool-stable hgb.  no evodence ot significant GI bleeding  2) J-tube blocked.   Plan 1) will change J-tube over a wire with radiology assistance/fluoro.  I have discussed the proceedure with patients son, Theadore Nanhillip Jemmott, he understands the need to change the tube and method of changing the tube, stated he knew  because of the last time it had to be changed.   Electronic Signatures: Barnetta ChapelSkulskie, Martin (MD)  (Signed 04-Apr-14 13:31)  Authored: Chief Complaint, VITAL SIGNS/ANCILLARY NOTES, Brief Assessment, Lab Results, Assessment/Plan   Last Updated: 04-Apr-14 13:31 by Barnetta ChapelSkulskie, Martin (MD)

## 2015-02-04 NOTE — H&P (Signed)
DATE OF BIRTH:  1945/12/09  DATE OF ADMISSION:  01/04/2013  REFERRING PHYSICIAN:  Dr. Daryel November  ADMITTING PHYSICIAN: Dr. Stephanie Acre  PRIMARY CARE PHYSICIAN:  None.  CHIEF COMPLAINT:  Brought in by EMS for hypotension and hypoxia.   HISTORY OF PRESENT ILLNESS:  This is a 69 year old African-American female with past medical history of status post cardiac arrest, acute encephalopathy, left cerebral stroke, hypomagnesemia, hypokalemia, hypophosphatemia, acute pulmonary embolism, status post IVC filter, acute on chronic diastolic failure, chronic respiratory failure, dysphagia, hypothyroidism, hypertension, diabetes, status post tracheostomy, who was brought in by EMS from [facility] secondary to hypotension and acute desaturation. History is limited, as the patient is aphakic and does not talk. Family is present at bedside, but does not know the events that took place early on tonight. Unable to get in contact with the nurse at [facility]. Per family, they were called and told that the patient had a drop in blood pressure acutely, and her saturations started to go down acutely at the nursing home. EMS was called, and she was brought to the ED. In the ED, it was noted that she has a tracheostomy site that is about a 73 old. The patient was noted to have thick, yellow secretions. Her trach was a non-cuff trach, it was taken out and replaced with a cuff trach. She was suctioned and had a lot of thick, yellow secretions, and placed on assist control by the ED physician. Her blood pressure was noted to be in the 70s and 80s systolic on admission, and acutely came up shortly afterwards with the administration of Levophed. A chest x-ray showed that she had right lower lobe diffuse infiltrates with right-sided atelectasis also. Family states that the patient was originally, about a month and a half ago, the patient was in her normal state of health until she started having  thick secretions that she could not clear. She has been a long-time smoker. They took her to the hospital in [place], and while she was there she had a significant mucus plug, developed cardiac arrest, and was in the ICU for about 6 days. Shortly after that, she developed a left-sided cerebellar stroke and had left-sided weakness and loss of sensation, and became aphasic. She was transferred to [facility] in [place] for vent management and weaning. Shortly afterwards, she had a tracheostomy done there, J tube placement done there, and once the tracheostomy was placed, she was able to get off mechanical ventilation, and she was placed on a non-cuff trach. She did fairly well and was transferred to [facility] to be close to the family. She arrived at [facility] on this past Friday, and both events ensued. Hospitalist services were consulted for further inpatient workup and management.   PRIMARY CARE PHYSICIAN:  [facility]  PAST MEDICAL HISTORY:  Cardiac arrest, acute cephalopathy, left cerebellar stroke, hypomagnesemia, hypokalemia, hypophosphatemia, history of pulmonary embolism, status post IVC filter, history of chronic diastolic failure, history of chronic respiratory failure, status post tracheostomy, history of dysphagia, hypothyroidism, hypertension, diabetes, history of seizures.  PAST SURGICAL HISTORY:  Tracheostomy and J tube placement.  MEDICATIONS PER THE MAR ARE AS FOLLOWS:  1.  K-Phos 1 tab t.i.d. per J tube. 2.  Magnesium oxide 400 mg, 1/2 tab b.i.d. per J tube. 3.  Multivitamins and minerals 15 mL per J tube. 4.  Nexium 40 mg packet, 1 packet daily per J tube for GI distress, n.p.o. 5.  nutrition formula  1.5 calories, 45 mL continuous per J tube. 6.  Pro-Stat AWC 30 mL per J tube for supplement. 7.  Simvastatin 5 mg per J tube for hyperlipidemia. 8.  Sodium bicarbonate vial 1 mL every 12 hours for mucus secretions. 9.  Synthroid 50  mcg per J tube daily. 10.  Water flushes 100 mL every 4 hours. 11.  DuoNeb 1 q. 6 hours as needed. 12.  Scopolamine patch 1.5 mg every 3 days. 13.  Accu-Cheks every 6 hours for diabetes.  14.  Guaifenesin solution/syrup 100 mg/5 mL, 10 mL every 6 hours per J tube. 15.  Keppra 500 mg 5 mL vial, 5 mL twice daily per J tube for seizure prophylaxis.   CODE STATUS:  The patient is a FULL CODE.  FAMILY HISTORY:  Mother and father with diabetes and hypertension.   SOCIAL HISTORY:  Prior to the events from 6 weeks ago, she was a smoker, 1/2 pack a day for about 38 years. Occasional drinker.   REVIEW OF SYSTEMS:  Unable to obtain, since patient is nonverbal.  PHYSICAL EXAMINATION:  At the time of my arrival, her physical examination and vital signs are as follows:   VITAL SIGNS:  Blood pressure is 112/64, temperature 98.6, pulse 96, respirations 98, 100% on PEEP of 5.  GENERAL APPEARANCE: A well-developed, well-nourished female lying in bed, not following commands, in no acute respiratory distress at this time.  HEENT: PERRLA, EOMI. No scleral icterus or conjunctivitis. Does not open mouth, cannot evaluate pharynx. Mucous membranes mildly dry. Dentition is poor. NECK:  She does have a tracheostomy in place. There are no secretions around the trach site. There is no erythema. There is no edema around the trach site. I could not palpate any nodules in the thyroid. Full range of motion is noted.  RESPIRATORY:  Coarse upper airway sounds. Decreased breath sounds bilaterally otherwise.  No wheezing and no crackles. No labored breathing is noted at this time, she is on a ventilator.  CARDIOVASCULAR:  Regular rate, regular rhythm. No murmurs. S1, S2 auscultated. No lower extremity edema.  ABDOMEN: Soft, nontender, nondistended. She does have a J tube site noted in the left upper quadrant of her abdomen. There is no discharge noted around the site.  MUSCULOSKELETAL:  Patient not following commands. She has  good tone noted.  SKIN:  No rash, no lesions, no erythema, no nodules. Skin is warm and dry.  LYMPHATIC:  No adenopathy noted in the cervical, axillary or supraclavicular regions.  NEUROLOGIC:  Difficult to assess cranial nerves II through XII. Deep tendon reflexes are intact. Sensory is intact. She does withdraw to pain on the left side and right side. She is aphasic and dysphagic also. She has some mild left upper extremity contracture.  PSYCHIATRIC:  Disoriented. Does not follow commands. She is somewhat alert.   LABS ARE AS FOLLOWS:  BNP is 602. Sodium 132, potassium 4.5, chloride 90, bicarb 37, BUN 64, creatinine 1.76, glucose 247. Osmolality of 291, AST/ALT is 42/45. Troponin is mildly elevated at 0.13. CK-MB is 0.7. CK is 129. White cell count is 17.8, hemoglobin 10.5, hematocrit 31.4, platelet count is 336. She had an ABG that showed a pH of 7.43, pCO2 of 59, pO2 of 107, 100% FiO2, bicarb 39. Lactic acid 2.9.   She did have a chest x-ray that showed tracheostomy tube in good position, right lung diffuse infiltrate and atelectasis, normal heart. There is a right lung infiltrate, especially in the lower lobe. EKG shows sinus  tachycardia, no ST-T wave changes, no depressions or elevations are noted.   ASSESSMENT AND PLAN: This is a 69 year old female with acute on chronic respiratory failure, left cerebellar stroke, pulmonary embolism, status post IVC filter, diastolic congestive heart failure, hypothyroidism, dysphagia, admitted for hypotension and hypoxia.   1.  Acute on chronic respiratory failure. Found to be acutely desaturated followed by hypotension. Upon ED arrival, her cuffless trach was changed to a cuff trach. She was placed on a ventilator and thick secretions were suctioned out. She stabilized with norepinephrine and being placed on a vent. Currently she is in stable condition. Her decompensation most likely occurs secondary to mucous plugging and increased secretions. Previously on a  scopolamine patch. Will stop this, and will stop guaifenesin. Will only put her on (Dictation Anomaly) roxanol 1ml b.i.d. Will also treat her as an aspiration pneumonia. Continue with DuoNeb, mechanical ventilation on a cuff trach currently, with good tidal volumes in the 500s, and pressure with assist control. Pulmonary consult in the morning with Dr. Jolayne Pantheronstant for vent management and trach management.   2.  Right lower lobe pneumonia, sepsis, most likely secondary to aspiration pneumonia. She does have a leukocytosis and a mild lactic acidosis noted. Will treat as stated above with mechanical ventilation, assist control with pressure. Continue with vancomycin and meropenem. Check blood and urine cultures. Will also check sputum cultures from her tracheostomy. Mild lactic acidosis at 2.9. Continue with IV fluids at 50 mL/hour and free water flushes. She did get 2 liters in the ED, and she has adequate urine output at this time. Will not give her any more boluses, given her diastolic congestive heart failure and elevated BNP.   3.  Left cerebellar ischemic stroke. Does not follow commands. She is aphasic and has dysphagia. Minor movements on her left side are noted. Continue with supportive care. Speech, PT and OT evaluation in the morning. Once able to wean off ventilator, will consider Passy-Muir valve evaluation.   4.  Dysphasia. Follow speech evaluation in the morning. Hold tube feeds for tonight. Continue with normal saline at 50 mL/hour and 100 mL of free water flushes every 6 hours of the J tube. Nutrition consult in the morning also.   5.  History of seizure. Continue with Keppra per J tube, and monitor electrolytes.   6.  Hypothyroidism. Continue with Synthroid.   7.  Social. Discussed at length with the family about her trach, and answered all questions. Family at this time stated they originally wanted an LTAC, but she was initially weaned off the vent and placed on a cuffless trach, and was  not a candidate at that time, and they want to readdress the issue of considering an LTAC evaluation.   8.  Gastrointestinal and deep vein thrombosis prophylaxis. Proton pump inhibitor and Lovenox.   9.  The patient is a FULL CODE.  PMD:  Nonlocal.   Time spent dictating and evaluating patient:  45 critical care minutes.     ____________________________ Stephanie AcreVishal Abrian Hanover, MD vm:mr D: 01/04/2013 22:07:40 ET T: 01/04/2013 23:00:31 ET JOB#: [id number]  cc: Stephanie AcreVishal Tyresa Prindiville, MD, <Dictator> Stephanie AcreVISHAL Verita Kuroda MD ELECTRONICALLY SIGNED 01/05/2013 14:18

## 2015-02-04 NOTE — Consult Note (Signed)
Impression:   69yo female w/ h/o CVA, cardiac arrest, chronic resp failure, s/p trach admitted with healthcare associated pneumonia and respiratory failure.     Her sputum culture is growing Methacillin Sensitive Staph aureus and Citrobacter, but her blood cultures are growing Enterococcus.  She is currenlty on levofloxacin and her WBC is improving.    I suspect that the Enterococcus is a contaminant.  Will not focus treatment on this.    Will change her to cephalexin per J-tube.  This is a better Staph drug and will also cover the Citrobacter.    Continue vent support as needed.    WBC has come down to normal.    No need to treat the yeast in the urine.  Electronic Signatures: Evetta Renner MPH, Rosalyn GessMichael E (MD)  (Signed on 26-Mar-14 17:21)  Authored  Last Updated: 26-Mar-14 17:21 by Timathy Newberry MPH, Rosalyn GessMichael E (MD)

## 2015-02-04 NOTE — H&P (Signed)
Past Med/Surgical Hx:  GI Bleed:   Diabetes:   HTN:   Dysphagia:   Diastolic Dysfunction:   hypophosphotemia:   Hypokalemia:   Hypomagnesemia:   encephalopathy:   Respiratory failure:   Pulmonary Embolus:   Seizures:   Brain injury:   Cardiac Arrest:   CVA/Stroke:   Pneumonia:   ALLERGIES:  Penicillin: Unknown   Assessment/Admission Diagnosis 69 yo female with chronic respiratory failure, left cerebellar stroke, PE s\p IVC filter, dCHF, hypothyroidism, dysphagia admitted for hypotension, hypoxia  1. Acute respiratory failure  - found to be acutely desaturating followed by hypotension, placed on the vent via trach and suctioned while in the ED, now stable - most likely secondary to mucus plugging and increased secretions - trach changed to cuffed trach - previous on scoplamine patch, will stop this and start roxanol 1ml bid - nebs - mechanical ventilation with cuffed trach (currently with good vt (500s)and pressure on assist control) - pulmonary consult in the AM (Dr. Belia HemanKasa)  2. RLL PNA\sepsis - most likely aspiration pneumonia - will treat as stated above and continue with vanc\meropenem - patient with thick yellow secretions, will check sputum culture from trach site - check blood and urine culture - mild lactic acidosis (2.5) - cont with IVF and free H20 flushes - UOP adequate, got 2L NS bolus, given her dCHF will not bolus anymore, especially with good UOP - levophed if needed to maintain SBP 100 to 140  3. Left cerebellar stroke - does not follow commands, aphasic - minor movements on the left side - cont with supportive care - ST\PT\OT in the am - once able to wean of vent consider paci-muer valve evaluation  4. Dysphagia - follow speech eval in the morning - hold tube feeds for tonight - NS @50cc \hr - 100 ml free water flush q6hrs of J-tube - nutrition consult in the AM  5. hx of Seizure - cont with keppra per J-tube - monitor electrolytes  6.  Hypothyroidism - cont with synthroid  7. Social  - Child psychotherapistsocial worker consult, family requesting LTAC evaluation  8. DM - SSI  GI/DVT prophylaxis - PPI/lovenox  FULL CODE PMD - non local   Plan Time spent evaluating patient = 45 critical care minutes ZOX#[id number]#[id number]   Electronic Signatures: Stephanie AcreMungal, Siah Kannan (MD)  (Signed 23-Mar-14 22:18)  Authored: PAST MEDICAL/SURGIAL HISTORY, ALLERGIES, HOME MEDICATIONS, ASSESSMENT AND PLAN   Last Updated: 23-Mar-14 22:18 by Stephanie AcreMungal, Shir Bergman (MD)

## 2015-02-04 NOTE — Consult Note (Signed)
Chief Complaint:  Subjective/Chief Complaint seen for heme positive stool.  minimal response to examiner.  no bm reported for 0ver 24 hours.   VITAL SIGNS/ANCILLARY NOTES: **Vital Signs.:   29-Mar-14 07:47  Temperature Temperature (F) 97.8    11:29  Pulse Pulse 92  Respirations Respirations 26  Systolic BP Systolic BP 732  Diastolic BP (mmHg) Diastolic BP (mmHg) 202  Mean BP 128  Pulse Ox % Pulse Ox % 97  Pulse Ox Activity Level  At rest  Oxygen Delivery Trach Aerosol Mask  Pulse Ox Heart Rate 90   Brief Assessment:  Cardiac Regular   Respiratory coarse aw noise, occ rhonchi   Gastrointestinal details normal Soft  Nontender  Nondistended  No masses palpable  Bowel sounds normal   Lab Results: Routine Chem:  29-Mar-14 04:23   Phosphorus, Serum 2.6 (Result(s) reported on 10 Jan 2013 at 05:06AM.)  Magnesium, Serum 2.3 (1.8-2.4 THERAPEUTIC RANGE: 4-7 mg/dL TOXIC: > 10 mg/dL  -----------------------)  Glucose, Serum  137  BUN  37  Creatinine (comp) 1.08  Sodium, Serum 143  Potassium, Serum  3.4  Chloride, Serum  108  CO2, Serum 29  Calcium (Total), Serum  7.8  Anion Gap  6  Osmolality (calc) 296  eGFR (African American) >60  eGFR (Non-African American)  53 (eGFR values <60m/min/1.73 m2 may be an indication of chronic kidney disease (CKD). Calculated eGFR is useful in patients with stable renal function. The eGFR calculation will not be reliable in acutely ill patients when serum creatinine is changing rapidly. It is not useful in  patients on dialysis. The eGFR calculation may not be applicable to patients at the low and high extremes of body sizes, pregnant women, and vegetarians.)  Result Comment LABS - This specimen was collected through an   - indwelling catheter or arterial line.  - A minimum of 591m of blood was wasted prior    - to collecting the sample.  Interpret  - results with caution.  Result(s) reported on 10 Jan 2013 at 05:06AM.  Routine Hem:   23-Mar-14 17:56   Hemoglobin (CBC)  10.5  24-Mar-14 04:05   Hemoglobin (CBC)  8.3  25-Mar-14 03:48   Hemoglobin (CBC)  7.8  26-Mar-14 04:08   Hemoglobin (CBC)  7.3  27-Mar-14 03:32   Hemoglobin (CBC)  7.0  28-Mar-14 03:54   Hemoglobin (CBC)  9.4  29-Mar-14 04:23   WBC (CBC) 9.9  RBC (CBC)  3.10  Hemoglobin (CBC)  9.3  Hematocrit (CBC)  27.9  Platelet Count (CBC) 298  MCV 90  MCH 29.9  MCHC 33.2  RDW  17.8  Neutrophil % 85.9  Lymphocyte % 8.3  Monocyte % 5.6  Eosinophil % 0.1  Basophil % 0.1  Neutrophil #  8.5  Lymphocyte #  0.8  Monocyte # 0.6  Eosinophil # 0.0  Basophil # 0.0 (Result(s) reported on 10 Jan 2013 at 04:57AM.)   Assessment/Plan:  Assessment/Plan:  Assessment 1) anemia, heme positive stool.  no bm for over 24 hours, no overt evidence of significant gi bleeding.  patient is not a candidate for sedated luminal evaluation.  Will check h. pylori serology.  continue bid,  will change carafate to suspension   Plan 1) feeding tube should be used with the jejunal port for feeds only or LIQUID meds.  other things particularly crushed meds need to be thoroughly crushed and solubilized with water to ensure they will not clog the tube, and the tube rinsed through to ensure no residual  remains in the tube. 2) trasnsfuse as needed.   Electronic Signatures: Loistine Simas (MD)  (Signed 29-Mar-14 13:05)  Authored: Chief Complaint, VITAL SIGNS/ANCILLARY NOTES, Brief Assessment, Lab Results, Assessment/Plan   Last Updated: 29-Mar-14 13:05 by Loistine Simas (MD)

## 2015-02-04 NOTE — Consult Note (Signed)
Chief Complaint:  Subjective/Chief Complaint seen for anemia, hemepositive stool Stable s/p tfx, no evidentd of dignificant GI bleeding. Apparently the jejunal port of the feeding tube has become clogged. Gastric port still functioning.  No change otherwise. minimal purposeful response.   VITAL SIGNS/ANCILLARY NOTES: **Vital Signs.:   30-Mar-14 12:00  Temperature Temperature (F) 97.8  Celsius 36.5  Temperature Source axillary  Pulse Pulse 74  Respirations Respirations 24  Systolic BP Systolic BP 146  Diastolic BP (mmHg) Diastolic BP (mmHg) 87  Mean BP 106  Pulse Ox % Pulse Ox % 100  Pulse Ox Activity Level  At rest  Oxygen Delivery Trach Aerosol Mask  Pulse Ox Heart Rate 74  *Intake and Output.:   30-Mar-14 06:00  Stool  150ml liquid brown stool   Brief Assessment:  Cardiac Regular   Respiratory clear BS   Gastrointestinal details normal Soft  Nondistended  Bowel sounds normal   Lab Results: Routine Micro:  28-Mar-14 14:49   Clostridium Diff Toxin by RT-PCR NEGATIVE-CLOS.DIFFICILE TOXIN NOT DETECTED BY PCR ---------------------------------- Test procedure integrates sample purification, nucleic acid amplification, and detection of the target Clostridium difficile sequence in simple or complex samplesusing real-time PCR and RT-PCR assays.  General Ref:  28-Mar-14 14:49   H.Pylori Stool Antigen ========== TEST NAME ==========  ========= RESULTS =========  = REFERENCE RANGE =  H.PYLORI,STOOL ANTIGEN  H. pylori Stool Ag, EIA H. pylori Stool Ag, EIA         [   Negative             ]          Negative               [facility]         No: [id number]           [address], [place], [place] [id number]           Mila HomerWilliam F Hancock, MD         [phone]   Result(s) reported on 11 Jan 2013 at 10:49AM.  Routine Chem:  30-Mar-14 03:47   Result Comment LABS - This specimen was collected through an   - indwelling catheter or arterial line.  - A minimum of  5mls of blood was wasted prior    - to collecting the sample.  Interpret  - results with caution.  Result(s) reported on 11 Jan 2013 at 04:22AM.  Potassium, Serum 3.6  Phosphorus, Serum  2.2 (Result(s) reported on 11 Jan 2013 at 04:24AM.)  Magnesium, Serum 2.2 (1.8-2.4 THERAPEUTIC RANGE: 4-7 mg/dL TOXIC: > 10 mg/dL  -----------------------)  Routine Hem:  23-Mar-14 17:56   Hemoglobin (CBC)  10.5  24-Mar-14 04:05   Hemoglobin (CBC)  8.3  25-Mar-14 03:48   Hemoglobin (CBC)  7.8  26-Mar-14 04:08   Hemoglobin (CBC)  7.3  27-Mar-14 03:32   Hemoglobin (CBC)  7.0  28-Mar-14 03:54   Hemoglobin (CBC)  9.4  29-Mar-14 04:23   Hemoglobin (CBC)  9.3   Assessment/Plan:  Assessment/Plan:  Assessment 1) anemia-stable no evidence of overt gi bleeding  2) DIARRHEA-C DIFF NEGATIVE, SoME WATERY STOOL THIS AM.   3) clog in J-tube-discussed with nursing. Solids? noted in j-port.  Patietn had a G-J tube placed in [place], not a type used here.  I have requested records form the facility where it was placed to ascertain type and management. Nursing told not to use the J-port, and detailed instructions fopr G-port use to avoid clogging that.  Plan as above. following.   Electronic Signatures: Barnetta Chapel (MD)  (Signed 30-Mar-14 15:11)  Authored: Chief Complaint, VITAL SIGNS/ANCILLARY NOTES, Brief Assessment, Lab Results, Assessment/Plan   Last Updated: 30-Mar-14 15:11 by Barnetta Chapel (MD)

## 2015-02-04 NOTE — Consult Note (Signed)
Brief Consult Note: Diagnosis: pneumonia/sepsis.   Patient was seen by consultant.   Consult note dictated.   Comments: Appreciate consult for 69 y/o [place] woman admitted with hypoxia and hypotension for evaluation of heme positive stool. Patient with recent history of cardiac arrest last month relating to mucous plugging during hospitalization [place] in [place], s/p trach and PEG; also had left cerebellar CVA at that time with resultant aphasia/left sided weakness, loss of sensation. History also significant for PE s/p IVS filter, CHF, chronic resp failure,htn, DM, seizures. Was vented and on pressors here for current illness until recently.  Patient is aphasic, does not follow commands, or track with eyes. No family is present. Information is gathered from nursing staff and chart. See H/P for adm details: noted hemoglobin trended down over 3d, from 10-8-7. Had transfusion and improved today at 9.4, normocytic with > RDW. No reports of  vomiting,  black/bloody stools or residual, no apparent abdominal pain. G-J tube was working well until today when the G tube portion began to leak. It is currently clamped and intact. She has evidentally received a large number of colace and Senna doses over the last couple of days to aid with defecation and now has had multiple numbers of loose brown stools necessitation rectal tube placement for skin protection.  The stool in the tube is thin and brown. She has been receiving pantoprazole via her PEG. Hemodynamically stable. Unknown history of endoscopies. Impression and PLan. Anemia, heme positive stool in the setting of critically ill patient, noted impression of extremely poor prognosis by attending physician today and palliative care notes. Patient is extremely high risk for sedated procedures at this point. She is at risk for stress ulcers due to her recent illnesses. Would treat empirically with bid IV PPI, Carafate 1gm qid when  feeding tube issue resolved, follow hemoglobin, and transfuse prn. Will discuss this further with Dr Marva PandaSkulskie.  Electronic Signatures: Vevelyn PatLondon, Christiane H (NP)  (Signed 28-Mar-14 12:55)  Authored: Brief Consult Note   Last Updated: 28-Mar-14 12:55 by Keturah BarreLondon, Christiane H (NP)

## 2015-02-04 NOTE — Consult Note (Signed)
PATIENT NAME:  [name], [name] MR#:  [id number] DATE OF BIRTH:  1946-06-08  DATE OF CONSULTATION:  01/09/2013  CONSULTING PHYSICIAN:  Keturah Barre, NP  HISTORY OF PRESENT ILLNESS: Appreciate consult for this 69 year old African American woman admitted with hypoxia and hypotension for evaluation of heme-positive stool. The patient is with recent history of cardiac arrest last month relating to mucus plugging during hospitalization for respiratory issues in [place], [place], with subsequent transfer to [facility] in [place] status post trach, PEG. Also during that same illness underwent left cerebellar CVA at that time with resultant aphasia, left-sided weakness, loss of sensation. History also significant for PE, status post IVC filter, CHF, chronic respiratory failure, hypertension, diabetes, seizures. This admission, she was requiring mechanical ventilation and pressor therapy initially. This has been weaned down and she has been put on trach collar today. The patient is aphasic, does not follow commands, track with eyes or respond to verbal. There is no family present. Information is gathered from nursing staff and chart. See H and P for admission details. It is noted hemoglobin trended down over the last 3 days from 10 to 8 to 7. Had transfusion and improved today at 9.4, is normocytic with increased RDW. No reports of vomiting, black or bloody stools or residual, no apparent abdominal pain. GJ tube was working well until today when the G portion began to leak. It is currently clamped and intact. She has evidently received a large number of Colace and senna doses over the last couple of days to aid with defecation and now has had multiple numbers of loose brown stools, necessitating rectal tube placement for skin protection. The stool in the tube is thin and brown. She has been receiving pantoprazole via her PEG. She is hemodynamically stable and known history of endoscopies. She has been a resident  of [facility].   PAST MEDICAL HISTORY: Cardiac arrest, encephalopathy, left cerebellar stroke, hypomagnesemia, hypokalemia, hypophosphatemia, pulmonary embolism, status post IVC filter, chronic diastolic failure, chronic respiratory failure, trach, PEG, dysphagia, hypothyroidism, hypertension, diabetes, seizures.   PAST SURGICAL HISTORY: Includes trach and J-tube placement.   MEDICATIONS: K-Phos 1 tab t.i.d., MagOx 400 mg 1/2 tab b.i.d., multivitamin and minerals 15 mL per J-tube, Nexium 40 mg daily, nutrition formula 1.5 calories 45 mL continuous per J-tube, Pro-Stat AWC 30 mL per J-tube, simvastatin 5 mg per J-tube, sodium bicarbonate q.12 for mucous secretions, Synthroid 50 mcg per J-tube daily, water flushes, DuoNebs, scopolamine patch q.3 days, Accu-Cheks q.6, guaifenesin 10 mL q.6, Keppra 500 mg b.i.d.   FAMILY HISTORY: Significant for diabetes, hypertension.   SOCIAL HISTORY: Resident at [facility]. Until 6 weeks ago, she did smoke 1/2 pack of cigarettes a day for 38 years. Had occasional alcoholic beverage.   REVIEW OF SYSTEMS: Unable to obtain at this time, as patient is nonverbal and there is no family present.   LABORATORY DATA: Most recent labs: Glucose 169, BUN 38, creatinine 1.06, potassium 3.0, GFR 54, calcium 7.9. WBC 10.0, hemoglobin 9.4, hematocrit 26.9, is normocytic with increased RDW. Noted that her BUN and creatinine have improved over this hospitalization. She was heme-positive on March 27. She does have MSSA cultures from her lungs.   PHYSICAL EXAMINATION:  VITAL SIGNS: Most recent temperature 98.6, pulse 64, respiratory rate 19, blood pressure 117/66, SaO2 of 99% on trach collar.  GENERAL: Chronically ill-appearing woman, appears comfortable.  HEENT: Atraumatic, normocephalic. Mucous membranes pink and moist. Trach tube intact mid trachea. Has clear secretions per cough with  no redness, drainage or inflammation noted.  RESPIRATORY: Shallow,  even, regular. Lung sounds coarse bilaterally.  CARDIAC: S1, S2, RRR. No MRG. Peripheral pulses 1+. Trace generalized edema. She has normal sinus rhythm per the monitor.  ABDOMEN: G J-tube intact left abdomen. Nondistended. Hypoactive bowel sounds x 4. Soft. No appreciable tenderness. No hepatosplenomegaly, bruits or other abnormalities noted.  GENITOURINARY: Foley catheter in place.  RECTAL: Rectal tube in place draining thin brown stool.  EXTREMITIES: Limited movement, particularly on the left side. No clubbing or cyanosis.  SKIN: Warm, dry, pink. No rash or lesion noted.  NEUROLOGIC: Aphasic. Opens eyes spontaneously. Unable to determine orientation. Does not track with eyes. Does have some minimal spontaneous movement to the right side.  PSYCHIATRIC: Unable to appreciate. Not following commands at present.   IMPRESSION AND PLAN: Anemia, heme-positive stool in the setting of critically ill patient. Noted impression of extremely poor prognosis by attending physician today and palliative care notes. The patient is extremely high risk for sedated procedures at this point. She is at risk for stress ulcers due to her recent illnesses. Would treat empirically with twice-daily intravenous proton pump inhibitor. Carafate 1 gram 4 times daily when feeding tube issue resolved. Follow hemoglobin and transfuse as needed. We will also assess Helicobacter pylori and stool antigen and Clostridium difficile. Dr. Marva PandaSkulskie will come in later and evaluate the percutaneous endoscopic gastrostomy.   These services were provided by Vevelyn Pathristiane London, MSN, [facility], in collaboration with Barnetta ChapelMartin Skulskie, MD, with whom I have discussed this patient in full.   Thank you very much for this consult.    ____________________________ Keturah Barrehristiane H. London, NP chl:jm D: 01/09/2013 14:29:46 ET T: 01/09/2013 15:34:11 ET JOB#: [id number]  cc: Keturah Barrehristiane H. London, NP, <Dictator> Eustaquio MaizeHRISTIANE H LONDON FNP ELECTRONICALLY SIGNED  01/27/2013 8:35

## 2015-02-04 NOTE — Consult Note (Signed)
PATIENT NAME:  [name], [name] MR#:  [id number] DATE OF BIRTH:  1946/10/03  DATE OF CONSULTATION:  01/07/2013  REFERRING PHYSICIAN:  Dr. Anselm Jungling CONSULTING PHYSICIAN:  Heinz Knuckles. Valoree Agent, MD  REASON FOR CONSULTATION: Pneumonia and bacteremia.  HIGH-LEVEL INFECTIOUS DISEASE CONSULT   HISTORY OF PRESENT ILLNESS: The patient is a 69 year old female with a past history significant for cardiac arrest, stroke, chronic respiratory failure status post tracheostomy who was admitted on 03/23 with hypotension and hypoxia. The patient is a resident at [facility] and was found to be hypoxic with low blood pressure and was sent via EMS to the Emergency Room. She is unable to provide any history due to her prior stroke and currently being intubated. The patient has had a chronic tracheostomy and was put back on a ventilator on admission. She was found to have some infiltrate on chest x-ray and sputum cultures are growing Citrobacter and methicillin sensitive Staphylococcus aureus. She is initially given vancomycin and azithromycin but is now changed to Levaquin. Her blood cultures have grown enterococcus. The patient is unable to provide any history and the history is obtained exclusively from the chart. She remains on the ventilator and is not responsive.   ALLERGIES:  INCLUDE PENICILLIN.   PAST MEDICAL HISTORY: 1.  She was admitted for respiratory illness approximately 6 weeks ago where she was found to have a mucous plug. She had a subsequent cardiac arrest and had a prolonged  hospitalization.  2.  Cerebellar stroke. She has persistent left hand weakness and aphasia. She was intubated and had a tracheostomy performed, as well as J-tube placement. She was breathing on her own and was transferred to [facility] within the last week.  3.  Pulmonary embolism status post IVC filter.  4.  Chronic diastolic heart failure.  5.  Hypothyroidism.  6.  Hypertension.  7.  Diabetes.  8.  Seizures.    FAMILY HISTORY:  Positive for diabetes and hypertension in her parents.   SOCIAL HISTORY: Prior to her recent hospitalization about approximately 6 weeks ago, she smoked 1/2 pack of cigarettes per day, she would occasionally drink. She currently is a nonsmoker and does not drink.  REVIEW OF SYSTEMS:  Unable to obtain from the patient due to her current clinical situation.   PHYSICAL EXAMINATION: VITAL SIGNS: T-max of 100.2, T-current of 98.4, pulse 82, blood pressure 101/54, 97% on the ventilator. Vent settings include pressure support with a rate of 22, tidal volume of 361, minute ventilation of 8.0, FiO2 of 30%, PEEP of 5, pressure support of 10.  GENERAL:  A 69 year old black female appearing critically ill.  HEENT: Normocephalic, atraumatic. Pupils appeared to be equal and reactive, unable to assess extraocular motion. She had nystagmus with opening her lids.  Oropharynx: The patient was noncompliant with exam.  NECK:  Midline tracheostomy in place. No lymphadenopathy. No thyromegaly.  CHEST:  Clear to auscultation bilaterally with good air movement. No focal consolidation.  HEART: Regular rate and rhythm without murmur, rub or gallop.  ABDOMEN: Soft, nontender, nondistended. No hepatosplenomegaly. No hernia is noted.  EXTREMITIES: No evidence for tenosynovitis.  SKIN: No rashes. No stigmata of endocarditis; specifically, no Janeway lesions nor Osler nodes.  NEUROLOGIC: The patient was unresponsive on the ventilator. She had some flexure contractions of her legs that appear to be chronic.  PSYCHIATRIC:  Unable to assess.   LABORATORY DATA: BUN of 25, creatinine 1.15, bicarbonate 29, anion gap of 5. LFTs on admission were slightly up with an AST  of 42, ALT 45, alk phos 152, total bilirubin 0.2. Repeat testing on 03/24 showed resolution of the abnormalities. White count from today is 7.8 with a hemoglobin of 7.3, platelet count of 255, ANC of 6.7. White count on admission was 17.8. Blood  cultures from admission are growing enterococcus in 1 of 4 bottles. Sputum culture is growing Citrobacter and methicillin-sensitive Staphylococcus aureus. No urinalysis was obtained. A urine culture is growing Candida albicans.  Chest x-ray from admission showed possible right-sided infiltrate. Chest x-ray from today showed patchy infiltrates bilaterally with hypoinflation.   IMPRESSION: A 69 year old female with a history of stroke, cardiac arrest, chronic respiratory failure status post tracheostomy admitted with healthcare-associated pneumonia and respiratory failure.   RECOMMENDATIONS: 1.  Sputum is growing methicillin-sensitive Staphylococcus aureus and Citrobacter but her blood cultures are growing enterococcus. She is currently on levofloxacin and her white count is improving. 2.  I suspect that her enterococcus is a contaminant. I would not focus treatment on this.  3.  We will change her to cephalexin per J-tube. This is a better staph drug and will also cover the Citrobacter.  4.  Will continue vent support as needed.  5.  The white cell count has come down to normal.  6.  There is no need to treat the yeast in the urine.  Thank you very much for involving me in the patient's care.    ____________________________ Heinz Knuckles. Ciearra Rufo, MD meb:ce D: 01/07/2013 17:31:00 ET T: 01/07/2013 18:14:28 ET JOB#: [id number]  cc: Heinz Knuckles. Mical Brun, MD, <Dictator> Ammie Warrick E Anarely Nicholls MD ELECTRONICALLY SIGNED 01/08/2013 9:45

## 2015-02-04 NOTE — Consult Note (Signed)
Chief Complaint:  Subjective/Chief Complaint J-tube changed over a wire with radiology assistance.  Position verified with fluoroscopy/contrast.   No immediate complications.  I will discuss use with nursing.   Electronic Signatures for Addendum Section:  Barnetta ChapelSkulskie, Jeremyah Jelley (MD) (Signed Addendum 04-Apr-14 18:05)  will sign off, reconsult as needed.   Electronic Signatures: Barnetta ChapelSkulskie, Petronella Shuford (MD)  (Signed 04-Apr-14 14:21)  Authored: Chief Complaint   Last Updated: 04-Apr-14 18:05 by Barnetta ChapelSkulskie, Karlea Mckibbin (MD)

## 2015-02-04 NOTE — Consult Note (Signed)
Chief Complaint:  Subjective/Chief Complaint Patietn seen and examined, please see full GI consult.  Patietn admitted for hypotension and hypoxia, in the setting of multiple medical problems, trach and peg in place.  Patient found to be heme positive, with some decline of HGB over course of hospitalization.  No overt evidence of GI bleeding such as hematemesis or melena.  Of note renal status has improved with hydration over the course of hospitalization, possible some amount of hgb drop due to dilutional.  Patietn is not a candidate for sedated luminal evaluation due to comorbidities.  Please see full GI consult and Brief consult note for complete recs.  Continue ppi, transfuse as needed, check for helicobacter pylori.  Of note feedign tube checked at request of nursing-this is a combination tube, PEG/PEJ with two ports.  Part had come apart, repaired, checked for patency, ok to use.  Recommend ONLY liquids meds and other solution be used through the tubes, as these are smaller diameter, and will clog more readily.   Following.   Electronic Signatures: Barnetta ChapelSkulskie, Martrell Eguia (MD)  (Signed 28-Mar-14 21:44)  Authored: Chief Complaint   Last Updated: 28-Mar-14 21:44 by Barnetta ChapelSkulskie, Timon Geissinger (MD)

## 2015-02-04 NOTE — Consult Note (Signed)
   Comments   I met with pt's son who is her 84. Son says that prior to pt's hospitalization in [place], she lived at home with him. She has needed a walker to ambulate since a CVA in 2009 but has otherwise done fairly well. She had a prolonged hospitalization starting 11/20/12 [place], [place]) for VDRF followed by an acute CVA. She was transferred to [facility] for care and got a trach & PEG there. She was transferred to [facility] on 3/21 (2 days pta).  is hopeful that pt will recover from this acute event. He has also been hopeful that pt's mental status would improve over time but is beginning to realize that it might not. Son seems to realize that he may need to make difficult decisions regarding pt's care but is unable to make those decisions at this time. We did discuss CPR/defibrillation in the event of cardiac arrest and he has decided that he does not want this attempted. Order entered for DNR but will continue vent support and pressors for now.   Electronic Signatures: Malayshia All, Izora Gala (MD)  (Signed 24-Mar-14 14:59)  Authored: Palliative Care   Last Updated: 24-Mar-14 14:59 by Orian Amberg, Izora Gala (MD)

## 2015-02-04 NOTE — Consult Note (Signed)
Chief Complaint:  Subjective/Chief Complaint Planning replacement of the PEJ over a wire under fluoroscopy tomorrow pm.  Chart  reviewed.  Discussed with Dr Manson PasseyBrown in radiology.   Electronic Signatures: Barnetta ChapelSkulskie, Martin (MD)  (Signed 02-Apr-14 17:03)  Authored: Chief Complaint   Last Updated: 02-Apr-14 17:03 by Barnetta ChapelSkulskie, Martin (MD)

## 2015-02-04 NOTE — Consult Note (Signed)
Chief Complaint:  Subjective/Chief Complaint seen for heme positive stool and malfunctioning pej.  stable no changes, no evidence of significant GI bleeding.   VITAL SIGNS/ANCILLARY NOTES: **Vital Signs.:   03-Apr-14 13:30  Vital Signs Type Routine  Temperature Temperature (F) 97.9  Celsius 36.6  Temperature Source AdultAxillary  Pulse Pulse 84  Respirations Respirations 18  Systolic BP Systolic BP 517  Diastolic BP (mmHg) Diastolic BP (mmHg) 77  Mean BP 90  Pulse Ox % Pulse Ox % 100  Pulse Ox Activity Level  At rest  Oxygen Delivery Trach Aerosol Mask   Brief Assessment:  Cardiac Regular   Respiratory coarse rhonchi/wheezes bilaterally, marked upper airway noise.   Gastrointestinal details normal Soft  Nontender  Nondistended  No masses palpable  Bowel sounds normal  No rebound tenderness   Lab Results: General Ref:  [id number] 10:62   Helicobacter pylori AB. IgG, IgA, IgM ========== TEST NAME ==========  ========= RESULTS =========  = REFERENCE RANGE =  HELICOBACTER P. AB PANEL  H pylori, IgM, IgG, IgA Ab H. pylori, IgG Abs              [   <0.9 U/mL            ]           0.0-0.8             Negative            <0.9                                             Indeterminate  0.9 - 1.0                                             Positive            >1.0 H. pylori, IgA Abs              [   <9.0 units           ]           0.0-8.9                                                Negative          <9.0                                                Equivocal   9.0 - 11.0                                                Positive         >11.0 H. pylori, IgM Abs              [   <9.0 units           ]  0.0-8.9                                                Negative          <9.0                                                Equivocal   9.0 - 11.0                                                Positive>11.0                                                                       .                This test was developed and its performance                characteristics determined by [facility]. It has not been                cleared or approved by the Food and Drug Administration.                Results of this test are for investigational purposes                only. The result should not be used as a diagnostic                procedure without confirmation of the diagnosis by  another medically diagnostic product or procedure.               [facility]             No: [id number]           [address] [place], [place] [address]           Lindon Romp, MD         [phone]   Result(s) reported on31 Mar 2014 at 10:48PM.  Routine Chem:  02-Apr-14 05:23   Result Comment LABS - This specimen was collected through an   - indwelling catheter or arterial line.  - A minimum of 37ms of blood was wasted prior    - to collecting the sample.  Interpret  - results with caution.  Result(s) reported on 14 Jan 2013 at 06:20AM.  Glucose, Serum  103  BUN 14  Creatinine (comp) 0.83  Sodium, Serum 140  Potassium, Serum 3.6  Chloride, Serum 104  CO2, Serum 30  Calcium (Total), Serum  7.8  Anion Gap  6  Osmolality (calc) 280  eGFR (African American) >60  eGFR (Non-African American) >60 (eGFR values <631mmin/1.73 m2 may be an indication of chronic kidney disease (CKD). Calculated eGFR is useful in patients with stable renal function. The eGFR calculation will not be reliable in acutely  ill patients when serum creatinine is changing rapidly. It is not useful in  patients on dialysis. The eGFR calculation may not be applicable to patients at the low and high extremes of body sizes, pregnant women, and vegetarians.)  Routine Hem:  02-Apr-14 05:23   WBC (CBC) 7.2  RBC (CBC)  3.35  Hemoglobin (CBC)  10.1  Hematocrit (CBC)  30.0  Platelet Count (CBC) 281  MCV 90  MCH 30.1  MCHC 33.6  RDW  17.8  Neutrophil % 67.9  Lymphocyte % 16.8   Monocyte % 9.7  Eosinophil % 5.2  Basophil % 0.4  Neutrophil # 4.9  Lymphocyte # 1.2  Monocyte # 0.7  Eosinophil # 0.4  Basophil # 0.0   Assessment/Plan:  Assessment/Plan:  Assessment 1) heme positive stool-h. pylori negative.  no apparent abdominal pain. hgb stable.  2) malfunctioning PEJ/clogged.   Plan 1) unable to change out the pej today due to ongoing tf.  I have held tube feeds, planning to change out the tube in fluoro tomorrow pm.   2) no luminal evaluation planned.   Electronic Signatures: Loistine Simas (MD)  (Signed 03-Apr-14 19:39)  Authored: Chief Complaint, VITAL SIGNS/ANCILLARY NOTES, Brief Assessment, Lab Results, Assessment/Plan   Last Updated: 03-Apr-14 19:39 by Loistine Simas (MD)

## 2015-02-04 NOTE — Discharge Summary (Signed)
PATIENT NAME:  [name], Meagan MR#:  [id number] DATE OF BIRTH:  02-Mar-1946  DATE OF ADMISSION:  01/04/2013 DATE OF DISCHARGE:  01/21/2013   ADDENDUM   This is an addendum to discharge summary earlier dictated by Dr. Auburn BilberryShreyang Patel on 01/17/2013. A carbon copy of that interim discharge summary is to be sent with this.  FINAL DISCHARGE DIAGNOSES:  1.  Acute on chronic respiratory failure due to pneumonia.  2.  History of stroke with leftover weakness, status post tracheostomy and percutaneous endoscopic gastrostomy.  3.  Acute on chronic gastrointestinal blood loss with hemoglobin stable.  4.  Hypothyroidism.  5.  Staphylococcus and citrobacter pneumonia, treated.  6.  History of seizure.  7.  Dysphagia due to stroke.   CODE STATUS: DNR.   DISCHARGE MEDICATIONS: Keppra 100 mg/mL oral solution give 5 mL every 12 hours via J-tube, cetirizine 10 mg oral tablet once a day via J-tube, scopolamine 1.5 mg transdermal extended-release film every 72 hours, simvastatin 5 mg tablet once a day, furosemide 20 mg tablet once a day, glycopyrrolate 1 mg tablet every 8 hours, docusate 10 mg/mL oral liquid give 5 mL 2 times a day as needed for constipation, potassium chloride 10 mEq orally every 12 hours, sucralfate 1 gram/10 mL oral suspension 4 times a day for 7 days, Synthroid 50 mcg once a day for hypothyroidism, multivitamin 5 mL orally once a day.   OXYGEN ON DISCHARGE: Yes, 28% via trach aerosol mask.   DIET ON DISCHARGE: J-tube feeding.  ACTIVITY: None.  FOLLOWUP: Routine with primary care physician.  HOSPITAL COURSE: After 5th of April, which was Dr. Serita GritShreyang Patel's interim discharge summary, the patient remained stable. She remained waiting because she had continuously secretion from her trach and she was using high amount of oxygen because of that. She was already on scopolamine, restarted on glycopyrrolate and cetirizine, and that helped to control the secretion in a much nicer way and so we  are able to discharge her now to nursing facility.  NOTE: This is a discharge summary which has 2 interim discharge summaries telling earlier course in the hospital. First of them was done by me on 01/10/2013 and the second was done on 01/17/2013.  ____________________________ Hope PigeonVaibhavkumar G. Elisabeth PigeonVachhani, MD vgv:jm D: 01/21/2013 15:01:51 ET T: 01/21/2013 15:20:26 ET JOB#: [id number]  cc: Hope PigeonVaibhavkumar G. Elisabeth PigeonVachhani, MD, <Dictator> Altamese DillingVAIBHAVKUMAR Roselee Tayloe MD ELECTRONICALLY SIGNED 01/25/2013 22:21

## 2015-04-11 IMAGING — CR DG ABDOMEN 1V
1 series · 3 of 3 positions shown · non-contrast
Comparison: none

REASON FOR EXAM: evaluate G tube
COMMENTS:

PROCEDURE:     DXR - DXR ABDOMEN AP ONLY  - January 24, 2013  [DATE]
RESULT:     Comparison: 01/11/2013

[Series 1: ap · 0.17mm/px · 3 of 3 slices shown]
[im 1/3]
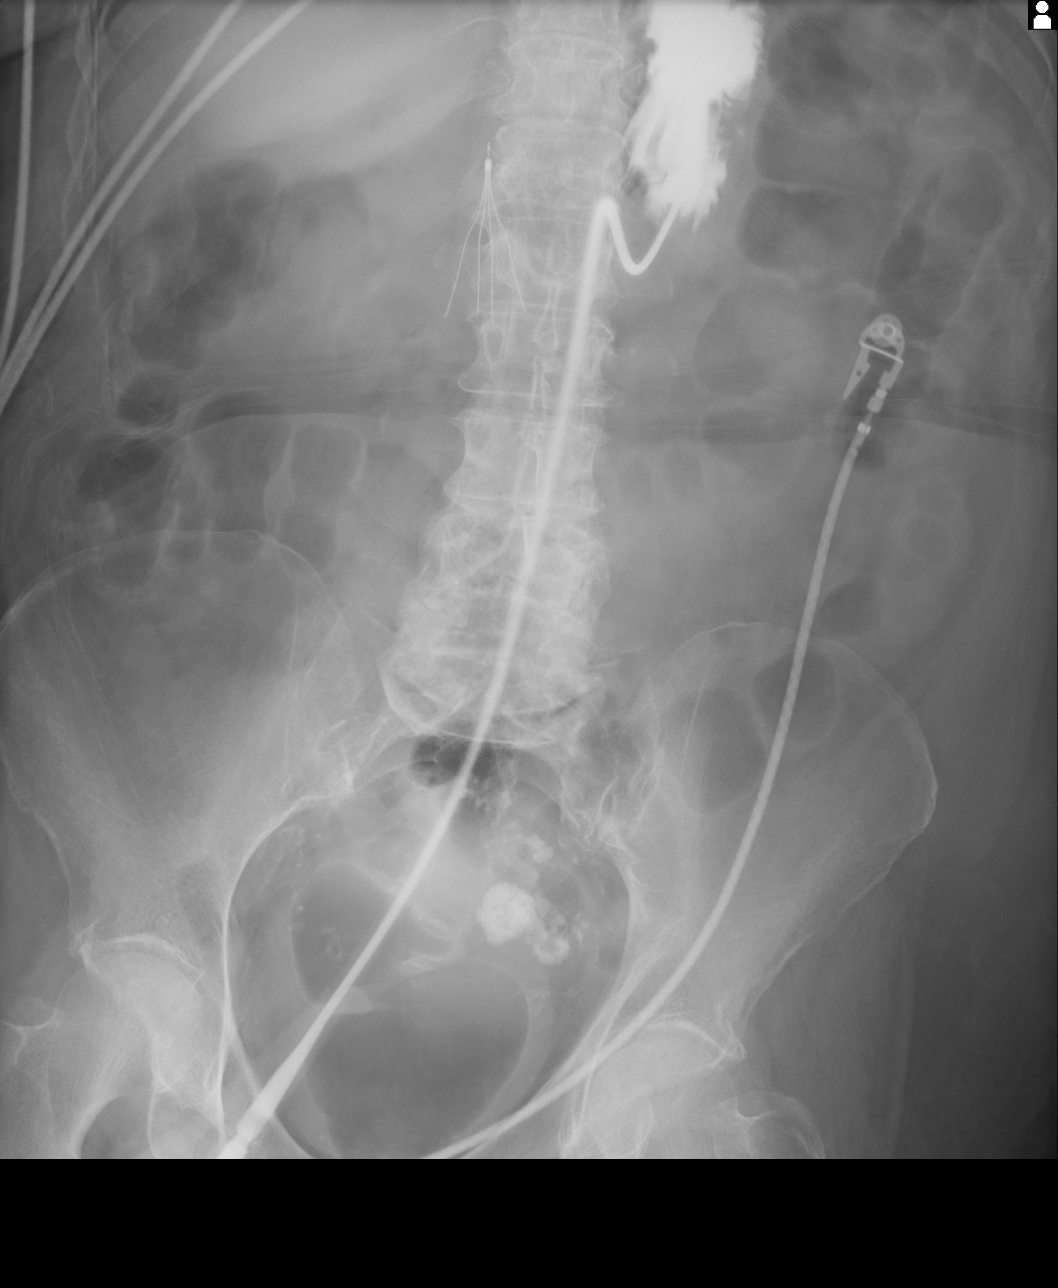
[im 2/3]
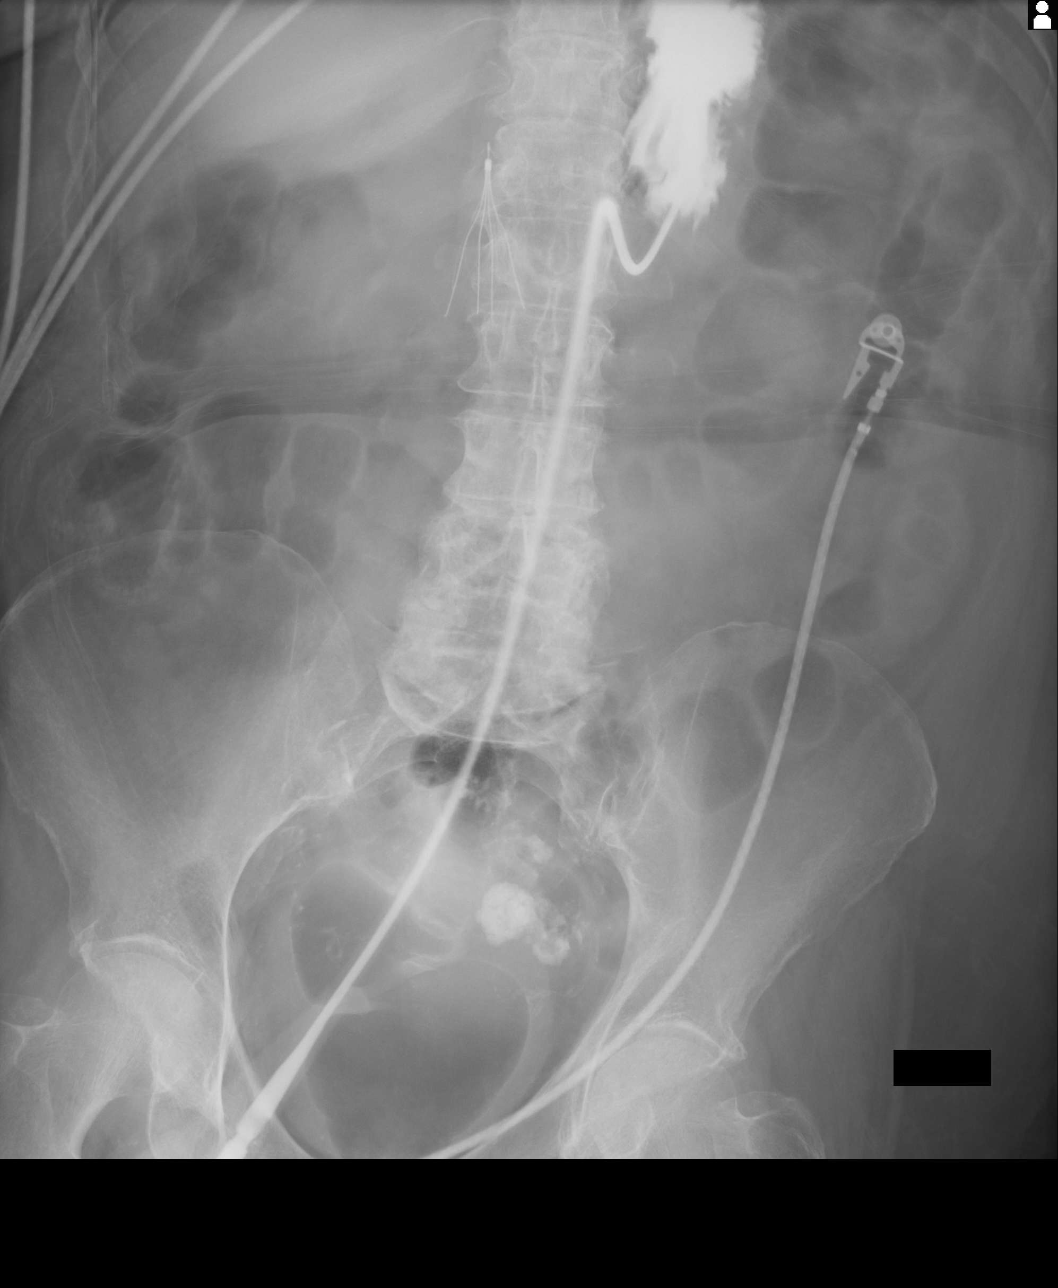
[im 3/3]
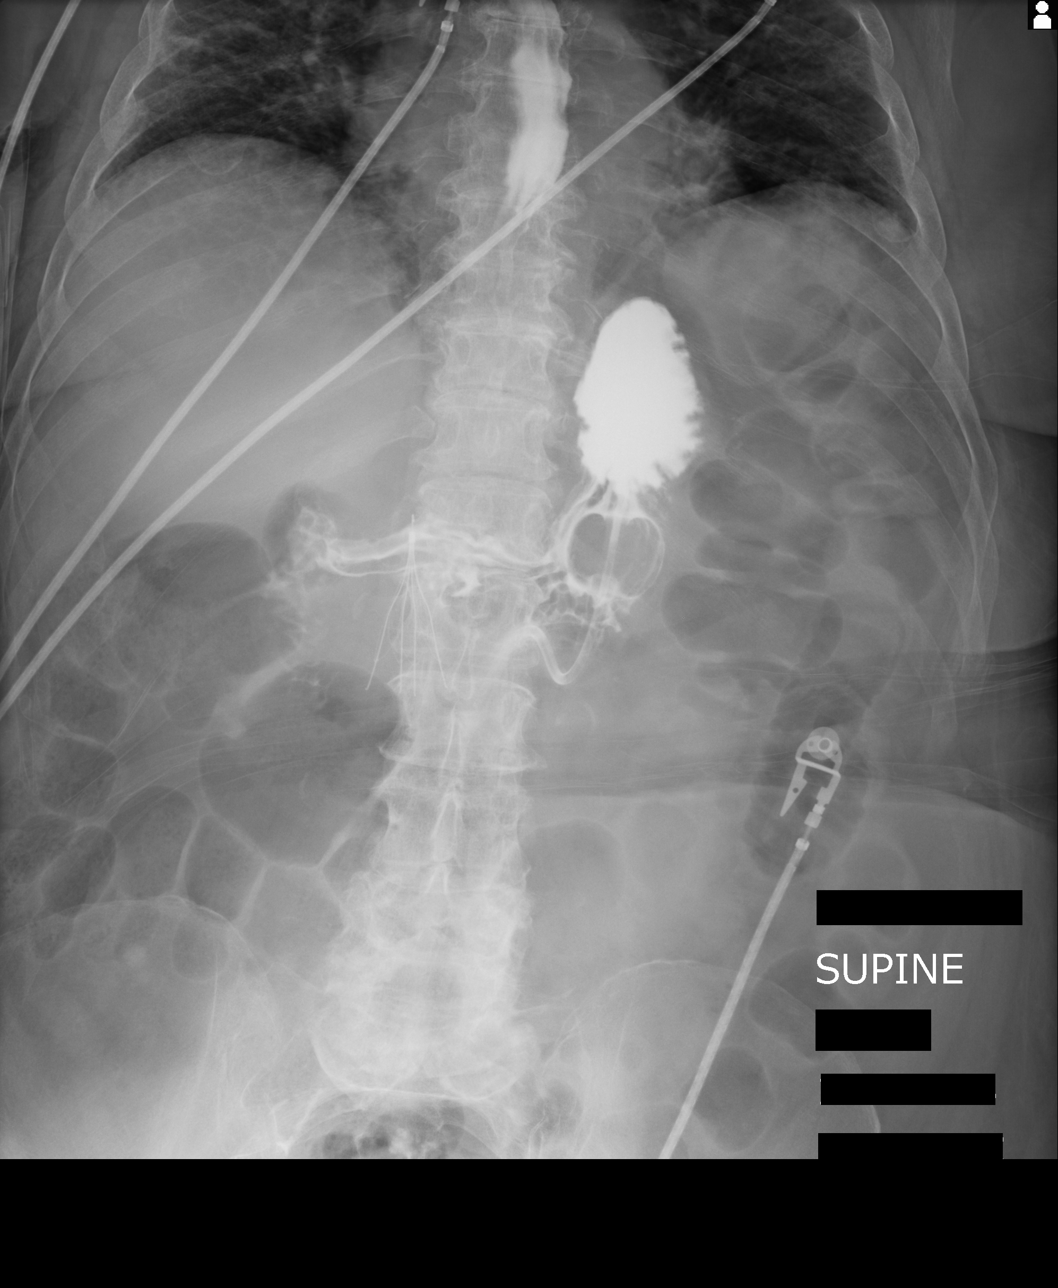

[3 of 3 positions shown; findings below may reference images not displayed]

FINDINGS: Abdominal radiographs were performed after injection of the G-tube with
water-soluble contrast by Dr. Mr-Leb. There is opacification of the
gastric folds and second segment of the duodenum. No evidence of
extraluminal extravasation. There is what likely represents reflux of
contrast material into the thoracic esophagus. Air seen within nondilated
small and large bowel. Calcifications in the pelvis likely represent
calcified fibroids. An IVC filter is present.

There are coarse reticular opacities in lung bases.
IMPRESSION: Opacification of the gastric folds, consistent with gastrostomy tube located
within the stomach.

[REDACTED]

## 2015-04-22 IMAGING — CR DG ABDOMEN 1V
1 series · 1 of 1 positions shown · non-contrast
Comparison: none

REASON FOR EXAM: check replacement PEG tube position, with contrastr.
Discussed with Dr Yg.
COMMENTS:   Bedside (portable):Y

PROCEDURE:     DXR - DXR ABDOMEN AP ONLY  - February 04, 2013  [DATE]
RESULT:     Comparison is made to prior study dated 01/24/2013.

[ap]
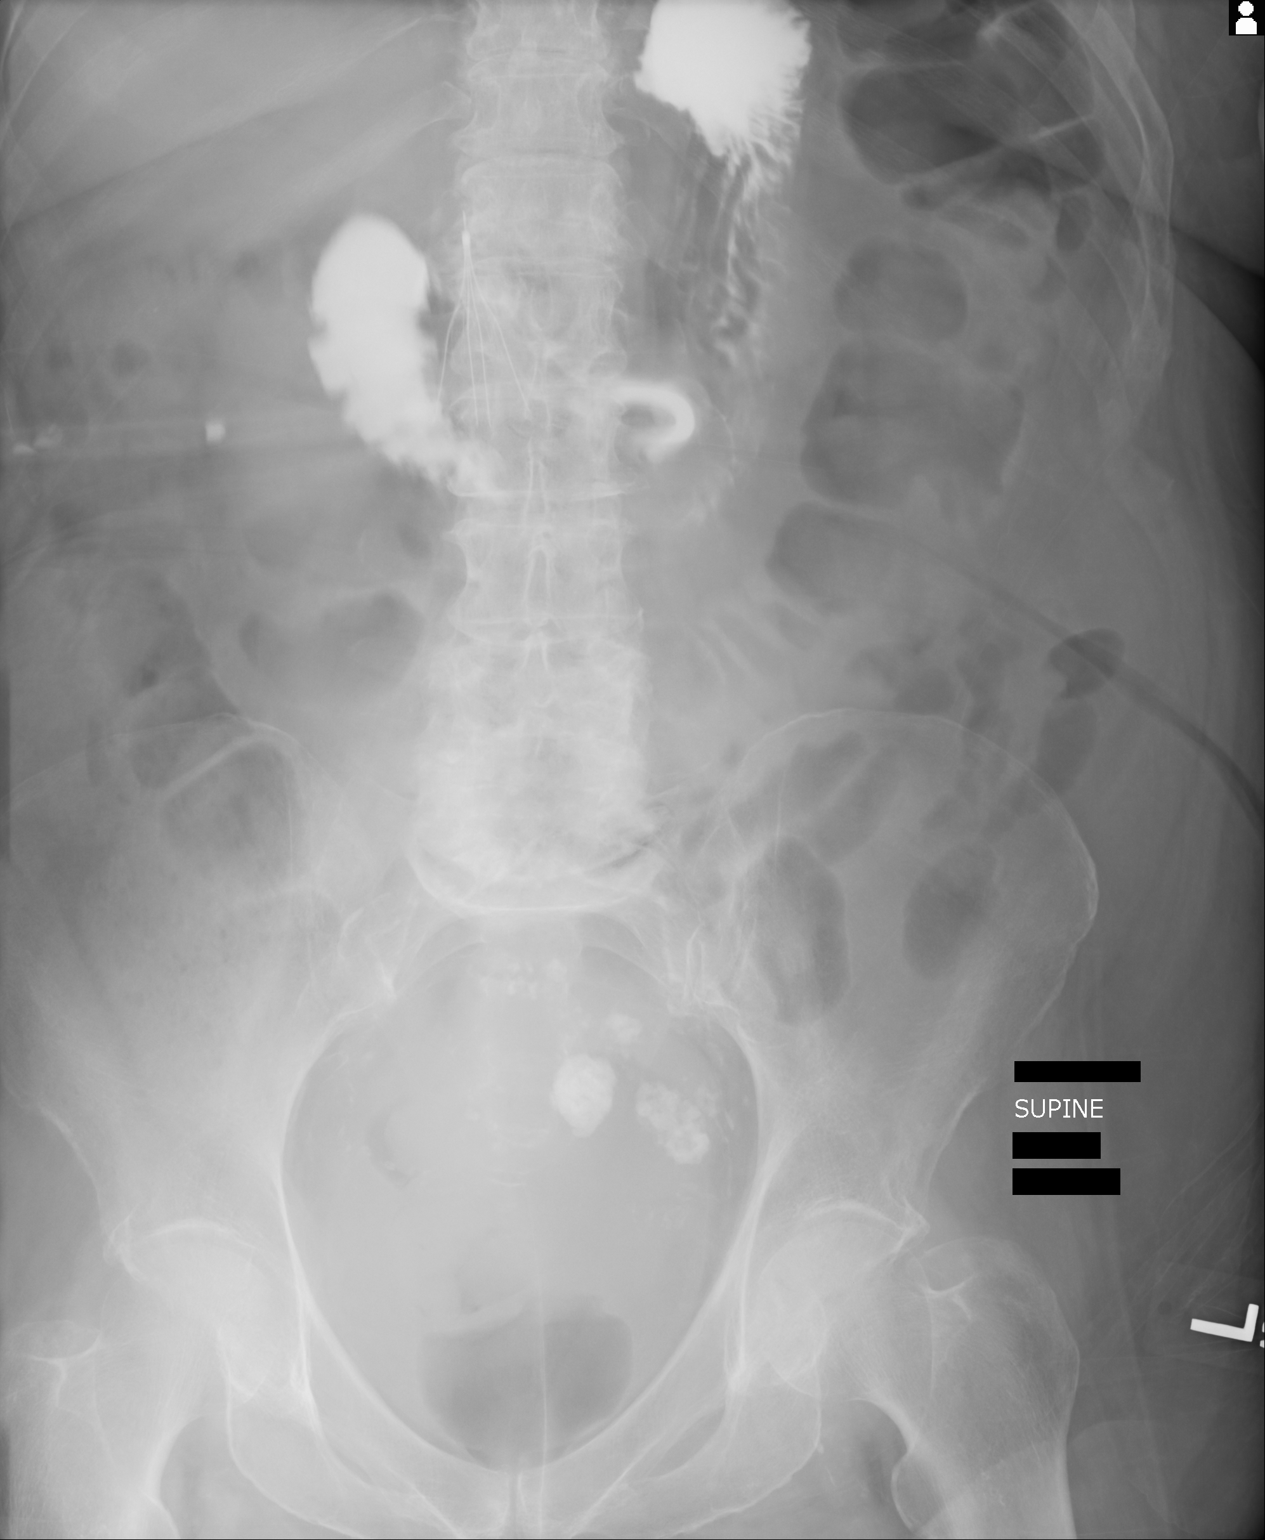

[1 of 1 positions shown; findings below may reference images not displayed]

FINDINGS: The patient is status post injection of radiopaque contrast into
feeding tube. Contrast is appreciated opacifying what appear to be the
gastric rugae. Contrast and opacification is also identified within the
first and second portions of the duodenum. Air is seen within nondilated
loops of large and small bowel. Coarse calcified densities project within
the pelvis likely representing dystrophic calcifications within fibroids.
The visualized bony skeleton demonstrates degenerative changes within the
lower lumbar spine.
IMPRESSION: 1. Contrast opacification in appears to be within the stomach and first and
second portions of the duodenum consistent with appropriate placement of the
gastric feeding tube.
